# Patient Record
Sex: Female | Born: 2000 | Race: Asian | Hispanic: No | Marital: Single | Smoking: Never smoker
Health system: Southern US, Community
[De-identification: ages and names within clinical notes are randomized; demographics above are authoritative.]

## PROBLEM LIST (undated history)

## (undated) ENCOUNTER — Inpatient Hospital Stay (HOSPITAL_COMMUNITY): Payer: Medicaid Other

## (undated) DIAGNOSIS — H539 Unspecified visual disturbance: Secondary | ICD-10-CM

## (undated) DIAGNOSIS — T7840XA Allergy, unspecified, initial encounter: Secondary | ICD-10-CM

## (undated) DIAGNOSIS — R519 Headache, unspecified: Secondary | ICD-10-CM

## (undated) DIAGNOSIS — R51 Headache: Secondary | ICD-10-CM

## (undated) DIAGNOSIS — E669 Obesity, unspecified: Secondary | ICD-10-CM

## (undated) DIAGNOSIS — Z8489 Family history of other specified conditions: Secondary | ICD-10-CM

## (undated) DIAGNOSIS — N12 Tubulo-interstitial nephritis, not specified as acute or chronic: Secondary | ICD-10-CM

## (undated) DIAGNOSIS — J45909 Unspecified asthma, uncomplicated: Secondary | ICD-10-CM

## (undated) HISTORY — PX: TONSILLECTOMY: SUR1361

## (undated) HISTORY — PX: WISDOM TOOTH EXTRACTION: SHX21

## (undated) HISTORY — PX: ADENOIDECTOMY: SUR15

---
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
## 2020-01-01 ENCOUNTER — Ambulatory Visit: Payer: Medicaid Other | Admitting: Physical Therapy

## 2020-01-01 ENCOUNTER — Ambulatory Visit: Payer: Medicaid Other

## 2020-01-01 ENCOUNTER — Other Ambulatory Visit: Payer: Self-pay

## 2020-01-01 ENCOUNTER — Ambulatory Visit: Payer: Medicaid Other | Admitting: Occupational Therapy

## 2020-01-01 DIAGNOSIS — M6281 Muscle weakness (generalized): Secondary | ICD-10-CM | POA: Diagnosis not present

## 2020-01-01 DIAGNOSIS — R2689 Other abnormalities of gait and mobility: Secondary | ICD-10-CM

## 2020-01-01 DIAGNOSIS — R471 Dysarthria and anarthria: Secondary | ICD-10-CM

## 2020-01-01 DIAGNOSIS — R278 Other lack of coordination: Secondary | ICD-10-CM

## 2020-01-01 DIAGNOSIS — R29818 Other symptoms and signs involving the nervous system: Secondary | ICD-10-CM

## 2020-01-01 DIAGNOSIS — R41841 Cognitive communication deficit: Secondary | ICD-10-CM

## 2020-01-01 DIAGNOSIS — R2681 Unsteadiness on feet: Secondary | ICD-10-CM

## 2020-01-01 NOTE — Therapy (Signed)
[facility] [address] [place], [name], [address] Phone: [phone]   Fax:  [phone]  Occupational Therapy Treatment  Patient Details  Name: [name] MRN: [id number] Date of Birth: 05-14-2001 Referring Provider (OT): Dr. Gaynell Face   Encounter Date: 01/01/2020  OT End of Session - 01/01/20 1453    Visit Number  8    Number of Visits  25    Date for OT Re-Evaluation  01/24/20    Authorization Type  Medicaid    Authorization - Visit Number  7    Authorization - Number of Visits  24    OT Start Time  1450    OT Stop Time  1530    OT Time Calculation (min)  40 min    Activity Tolerance  Patient tolerated treatment well    Behavior During Therapy  [facility] for tasks assessed/performed       Past Medical History:  Diagnosis Date  . Allergy   . Allergy    Multiple allergies - see allergy list  . Alopecia   . Asthma   . Asthma   . Family history of adverse reaction to anesthesia    Mom - Difficult to anesthesize/Hypotension  . Headache    With wheezing per Mom  . 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 Pyelonephritis 07/12/2019  . Vision abnormalities    Diminished vision Left Eye per Mom    Past Surgical History:  Procedure Laterality Date  . ADENOIDECTOMY    . ESOPHAGOGASTRODUODENOSCOPY (EGD) WITH PROPOFOL N/A 09/27/2017   Procedure: ESOPHAGOGASTRODUODENOSCOPY (EGD) WITH PROPOFOL;  Surgeon: Joycelyn Rua, MD;  Location: [facility];  Service: Gastroenterology;  Laterality: N/A;  . PEG PLACEMENT N/A 09/27/2017   Procedure: PERCUTANEOUS ENDOSCOPIC GASTROSTOMY (PEG) PLACEMENT;  Surgeon: Joycelyn Rua, MD;  Location: [facility];  Service: Gastroenterology;  Laterality: N/A;  . TONSILLECTOMY    . WISDOM TOOTH EXTRACTION      There were no vitals filed for this visit.  Subjective Assessment - 01/01/20 1550    Currently in Pain?  No/denies    Pain Onset  More than a month ago                Treatment: therapist checked progress towards goals. Pt demonstrates excellent overall progress. Pt demonstrates ability to write  aparagraph with 100% legibility. See goals for updates.            OT Education - 01/01/20 1524    Education Details  reveiwed yellow theraband HEP, 15 reps each, min v.c    Person(s) Educated  Patient;Parent(s)    Methods  Explanation;Demonstration    Comprehension  Verbalized understanding;Returned demonstration;Verbal cues required       OT Short Term Goals - 01/01/20 1455      OT SHORT TERM GOAL #1   Title  I with inital HEP-12/10/2019    Baseline  dependent    Time  6    Period  Weeks    Status  On-going    Target Date  12/10/19      OT SHORT TERM GOAL #2   Title  Pt will wash her hair with min A and min v.c    Baseline  mod A with hair washing    Time  6    Period  Weeks    Status  Achieved   partially met, pt does 50% of the time     OT SHORT TERM GOAL #3  Title  Pt will increase bilateral grip strength by 5 lbs for increased functional use during ADLs    Baseline  RUE avg 33.6, LUE avg 34.1    Time  6    Period  Weeks    Status  Achieved   LUE 39.6, RUE43.1     OT SHORT TERM GOAL #4   Title  Pt will demonstrate ability to write a short paragraph with 100% legibility in a reasonable amount of time    Baseline  demonstrates good legibility at sentence level but requires increased time    Time  6    Period  Weeks    Status  Achieved   100%     OT SHORT TERM GOAL #5   Title  Pt will demonstrate improved fine motor coordination for ADLS as evidenced by decreasing 9 hole peg test score to 70 secs    Baseline  RUE 80 secs    Time  6    Period  Weeks    Status  Achieved   53.22 secs     OT SHORT TERM GOAL #6   Title  Pt will demonstrate improved fine motor coordination for ADLs as evidenced by decreasing LUE 9 hole peg test score to 2 mins and 30 secs    Baseline  2 mins 39 secs    Time  6    Period   Weeks    Status  Achieved   1 min 26 secs     OT SHORT TERM GOAL #7   Title  Pt/ mother will verbalize understanding of adapted strategies to maximize pt's safety and independence with ADLs/ IADLs.    Baseline  dependent    Time  6    Period  Weeks    Status  On-going        OT Long Term Goals - 10/26/19 1204      OT LONG TERM GOAL #1   Title  I with updated HEP.01/24/2020    Baseline  dependent    Time  12    Period  Weeks    Status  New    Target Date  01/24/20      OT LONG TERM GOAL #2   Title  Pt will perfrom basic home management tasks modified independently.    Baseline  dependent    Time  12    Period  Weeks    Status  New      OT LONG TERM GOAL #3   Title  Pt will perform simple cooking tasks with supervision and min v.c    Baseline  dependent    Time  12    Period  Weeks    Status  New      OT LONG TERM GOAL #4   Title  Pt will wash and style her hair modified independently.    Baseline  mod A    Time  12    Period  Weeks      OT LONG TERM GOAL #5   Title  Pt will demonstrate adequate bilateral UE control in order to paint her own nails.    Baseline  dependent    Time  12    Period  Weeks    Status  New            Plan - 01/01/20 1525    Clinical Impression Statement  Pt demonstrates good progress towards short term goals. Patient missed last week due to illness.  OT Occupational Profile and History  Problem Focused Assessment - Including review of records relating to presenting problem    Occupational performance deficits (Please refer to evaluation for details):  ADL's;IADL's;Education;Leisure;Social Participation    Body Structure / Function / Physical Skills  ADL;UE functional use;Balance;Vision;Flexibility;FMC;ROM;Gait;Coordination;GMC;Decreased knowledge of precautions;IADL;Decreased knowledge of use of DME;Dexterity;Strength;Mobility    Cognitive Skills  Attention;Energy/Drive;Memory;Problem Solve;Safety Awareness;Understand;Thought     Rehab Potential  Good    Clinical Decision Making  Limited treatment options, no task modification necessary    Comorbidities Affecting Occupational Performance:  May have comorbidities impacting occupational performance    Modification or Assistance to Complete Evaluation   No modification of tasks or assist necessary to complete eval    OT Frequency  2x / week   plus eval   OT Duration  12 weeks    OT Treatment/Interventions  Self-care/ADL training;Ultrasound;Visual/perceptual remediation/compensation;Energy conservation;Aquatic Therapy;DME and/or AE instruction;Patient/family education;Balance training;Passive range of motion;Paraffin;Cryotherapy;Fluidtherapy;Functional Mobility Training;Moist Heat;Therapeutic exercise;Manual Therapy;Therapeutic activities;Cognitive remediation/compensation;Neuromuscular education    Plan  check theraputty HEP,    Consulted and Agree with Plan of Care  Patient;Family member/caregiver    Family Member Consulted  mother       Patient will benefit from skilled therapeutic intervention in order to improve the following deficits and impairments:   Body Structure / Function / Physical Skills: ADL, UE functional use, Balance, Vision, Flexibility, FMC, ROM, Gait, Coordination, GMC, Decreased knowledge of precautions, IADL, Decreased knowledge of use of DME, Dexterity, Strength, Mobility Cognitive Skills: Attention, Energy/Drive, Memory, Problem Solve, Safety Awareness, Understand, Thought     Visit Diagnosis: Muscle weakness (generalized)  Other lack of coordination  Other abnormalities of gait and mobility  Unsteadiness on feet  Other symptoms and signs involving the nervous system    Problem List Patient Active Problem List   Diagnosis Date Noted  . Muscle spasm of back 07/14/2019  . Pyelonephritis 07/12/2019  . COVID-19 07/12/2019  . Peripheral neuropathy 08/24/2018  . Poor sleep hygiene 07/11/2018  . 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 Left hemiparesis ([place]) 01/17/2018  . 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 Anoxic brain injury ([place]) 09/28/2017  . 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 Spasticity 09/28/2017  . Encephalopathy 09/19/2017  . PCOS (polycystic ovarian syndrome) 09/29/2016  . Low bone density 09/29/2016  . 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 Severe asthma with acute exacerbation 07/26/2015  . Seasonal and perennial allergic rhinitis 07/26/2015  . Obesity peds (BMI >=95 percentile) 02/29/2012    Deundre Thong 01/01/2020, 3:50 PM  [facility] [address] [place], [name], [address] Phone: [phone]   Fax:  [phone]  Name: [name] MRN: [id number] Date of Birth: 30-Mar-2001

## 2020-01-01 NOTE — Patient Instructions (Signed)
   Practice abdominal breathing 15 minutes x2/day.   If you can, extend the breath cycle past one second.

## 2020-01-01 NOTE — Therapy (Signed)
[facility] [address] [place], [name], [address] Phone: [phone]   Fax:  [phone]  Speech Language Pathology Treatment  Patient Details  Name: [name] MRN: [id number] Date of Birth: 2001-02-15 Referring Provider (SLP): Wyline Copas, MD   Encounter Date: 01/01/2020  End of Session - 01/01/20 1636    Visit Number  2    Number of Visits  17    Date for SLP Re-Evaluation  02/16/20    Authorization Type  Medicaid (pt/ot plan for 24 sessions each)    Authorization - Visit Number  1    Authorization - Number of Visits  16    SLP Start Time  3875    SLP Stop Time   6433    SLP Time Calculation (min)  42 min    Activity Tolerance  Patient tolerated treatment well;No increased pain       Past Medical History:  Diagnosis Date  . Allergy   . Allergy    Multiple allergies - see allergy list  . Alopecia   . Asthma   . Asthma   . Family history of adverse reaction to anesthesia    Mom - Difficult to anesthesize/Hypotension  . Headache    With wheezing per Mom  . 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 Pyelonephritis 07/12/2019  . Vision abnormalities    Diminished vision Left Eye per Mom    Past Surgical History:  Procedure Laterality Date  . ADENOIDECTOMY    . ESOPHAGOGASTRODUODENOSCOPY (EGD) WITH PROPOFOL N/A 09/27/2017   Procedure: ESOPHAGOGASTRODUODENOSCOPY (EGD) WITH PROPOFOL;  Surgeon: Joycelyn Rua, MD;  Location: [facility];  Service: Gastroenterology;  Laterality: N/A;  . PEG PLACEMENT N/A 09/27/2017   Procedure: PERCUTANEOUS ENDOSCOPIC GASTROSTOMY (PEG) PLACEMENT;  Surgeon: Joycelyn Rua, MD;  Location: [facility];  Service: Gastroenterology;  Laterality: N/A;  . TONSILLECTOMY    . WISDOM TOOTH EXTRACTION      There were no vitals filed for this visit.  Subjective Assessment - 01/01/20 1545    Subjective  "[name]"    Patient is accompained by:   Family member   mom   Currently in Pain?  No/denies            ADULT SLP TREATMENT - 01/01/20 1545      General Information   Behavior/Cognition  Alert;Pleasant mood;Cooperative      Treatment Provided   Treatment provided  Cognitive-Linquistic      Cognitive-Linquistic Treatment   Treatment focused on  Dysarthria;Apraxia    Skilled Treatment  SLP asked pt what she recalls about breathing from previous ST - pt reports it's been 6 months since last ST and she did not recall anything. Pt was working at spontaneous sentence level with coordination of respiratory and articulatory systems, pt stated previous SLP had pt bringing IN stomach with inhalation. SLp worked with pt with abdominal breathing (AB) with initial success approx 75% in total 7 minutes of practice. VERY SHALLOW breath cycle so SLP had pt take as deep of breaths aas she could -even with clavicular breathing x10 to give some tactile reinforcement of how much lung capacity pt has. When SLP went back to AB pt now breathing with clavicular breathing x3 attempts at AB. SLP backtracked to sole focus on AB instead of AB with longer breath cycle. After approx 15 breaths pt appeared to attempt to incr time of breath cycle to total 3+  seconds (from ~2 seconds) and was successful approx 30% of remaining practice (approx 60 seconds). SLP told pt to practice AB 15 minutes x2/day, and told pt about the breathe 2 relax app and tht it would try to have her inhale for 7 seconds and pt unlikely to achieve that at this time but encouraged longer breath cycle, with caveat that pt had to be able to master AB first then she could try the extending breath cycle.       Assessment / Recommendations / Plan   Plan  Continue with current plan of care      Progression Toward Goals   Progression toward goals  Progressing toward goals       SLP Education - 01/01/20 1636    Education Details  abdominal breathing, breathe 2 relax app,    Person(s)  Educated  Patient;Parent(s)    Methods  Explanation;Demonstration;Verbal cues    Comprehension  Verbalized understanding;Returned demonstration;Verbal cues required;Need further instruction       SLP Short Term Goals - 01/01/20 1637      SLP SHORT TERM GOAL #1   Title  Patient will demonstrate abdominal breathing at rest 85% accuracy over 5 minutes.    Baseline  clavicular breathing    Time  4    Period  Weeks    Status  On-going      SLP SHORT TERM GOAL #2   Title  Patient will coordination respiration and phonation at phrase level 85% accuracy x 3 sessions.    Baseline  50%    Time  4    Period  Weeks    Status  On-going      SLP SHORT TERM GOAL #3   Title  Patient will use slowed rate to compensate for dysarthria and allow additional motor processing time over 5 minute simple conversation with rare min cues.    Baseline  not currently using strategies    Time  4    Period  Weeks    Status  On-going       SLP Long Term Goals - 01/01/20 1637      SLP LONG TERM GOAL #1   Title  Patient will generate sentences with abdominal breathing 85% accuracy x3 sessions.    Baseline  0%    Time  8    Period  Weeks    Status  On-going      SLP LONG TERM GOAL #2   Title  Patient will increase breath groups to average 3+ words in 5 minute simple conversation x3 visits.    Baseline  average 1 word    Time  8    Period  Weeks    Status  On-going      SLP LONG TERM GOAL #3   Title  Patient will increase intelligibility from 70% to 85% in 5 minutes simple conversation with use of breathing techniques and compensations for dysarthria.    Baseline  75%    Time  8    Period  Weeks    Status  On-going       Plan - 01/01/20 1637    Clinical Impression Statement  Patient presents with moderate-severe ataxic dysarthria; discoordination impacts all speech subsystems, including respiration, phonation, resonance and articulation. Quivering/trembling movements noted throughout.  Cognitive/processing deficits appear to negatively impact speech production; pt had more difficulty with reading tasks than in spontaneous speech. Patient and mother report priority is pt's intelligibility at this time. Patient enjoys games, anime, and  communicating online with friends in [place]. She hopes to eventually complete remaining 5 HS credits at [employer], but does not have plans to do at this time. I recommend skilled ST to address dysarthria in order to improve pt's intelligibility and increase length of utterance for simple conversations with friends and family. ST to consider assessment/treatment of cognitive-linguistic deficits but will focus on dysarthria at this time due to patient goals.    Speech Therapy Frequency  2x / week    Duration  --   8 weeks or 17 visits   Treatment/Interventions  Language facilitation;Environmental controls;Cueing hierarchy;SLP instruction and feedback;Compensatory techniques;Cognitive reorganization;Functional tasks;Compensatory strategies;Internal/external aids;Multimodal communcation approach;Patient/family education    Potential to Achieve Goals  Good    Potential Considerations  Other (comment);Cooperation/participation level   time post onset   Consulted and Agree with Plan of Care  Patient       Patient will benefit from skilled therapeutic intervention in order to improve the following deficits and impairments:   Dysarthria and anarthria  Cognitive communication deficit    Problem List Patient Active Problem List   Diagnosis Date Noted  . Muscle spasm of back 07/14/2019  . Pyelonephritis 07/12/2019  . COVID-19 07/12/2019  . Peripheral neuropathy 08/24/2018  . Poor sleep hygiene 07/11/2018  . 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 Left hemiparesis (HCC) 01/17/2018  . 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 Anoxic brain injury (HCC) 09/28/2017  .  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 Spasticity 09/28/2017  . Encephalopathy 09/19/2017  . PCOS (polycystic ovarian syndrome) 09/29/2016  . Low bone density 09/29/2016  . 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 Severe asthma with acute exacerbation 07/26/2015  . Seasonal and perennial allergic rhinitis 07/26/2015  . Obesity peds (BMI >=95 percentile) 02/29/2012    [facility] ,MS, CCC-SLP  01/01/2020, 4:38 PM  [facility] [address] [place], [place], [address] Phone: [phone]   Fax:  [phone]   Name: [name] MRN: [id number] Date of Birth: 10-26-2001

## 2020-01-03 ENCOUNTER — Other Ambulatory Visit: Payer: Self-pay

## 2020-01-03 ENCOUNTER — Encounter: Payer: Self-pay | Admitting: Physical Therapy

## 2020-01-03 ENCOUNTER — Ambulatory Visit: Payer: Medicaid Other | Admitting: Speech Pathology

## 2020-01-03 ENCOUNTER — Ambulatory Visit (INDEPENDENT_AMBULATORY_CARE_PROVIDER_SITE_OTHER): Payer: Medicaid Other | Admitting: Pediatrics

## 2020-01-03 ENCOUNTER — Ambulatory Visit: Payer: Medicaid Other | Admitting: Physical Therapy

## 2020-01-03 ENCOUNTER — Ambulatory Visit: Payer: Medicaid Other | Admitting: Occupational Therapy

## 2020-01-03 DIAGNOSIS — R278 Other lack of coordination: Secondary | ICD-10-CM

## 2020-01-03 DIAGNOSIS — M6281 Muscle weakness (generalized): Secondary | ICD-10-CM

## 2020-01-03 DIAGNOSIS — R29818 Other symptoms and signs involving the nervous system: Secondary | ICD-10-CM

## 2020-01-03 DIAGNOSIS — R2681 Unsteadiness on feet: Secondary | ICD-10-CM

## 2020-01-03 DIAGNOSIS — R471 Dysarthria and anarthria: Secondary | ICD-10-CM

## 2020-01-03 DIAGNOSIS — R2689 Other abnormalities of gait and mobility: Secondary | ICD-10-CM

## 2020-01-03 NOTE — Therapy (Signed)
[facility] [address] [place], [name], [address] Phone: [phone]   Fax:  [phone]  Physical Therapy Treatment  Patient Details  Name: [name] MRN: [id number] Date of Birth: [birth year] Referring Provider (PT): Dr. Wyline Copas   Encounter Date: 01/03/2020  PT End of Session - 01/03/20 2218    Visit Number  14    Number of Visits  24    Date for PT Re-Evaluation  02/27/20    Authorization Type  Medicaid    Authorization Time Period  16 visits approved from 10/08/19-12/02/2019; 16 visits approved 12/05/2019-01/29/2020    Authorization - Visit Number  5    Authorization - Number of Visits  16    PT Start Time  4665    PT Stop Time  9935    PT Time Calculation (min)  43 min    Equipment Utilized During Treatment  Gait belt    Activity Tolerance  Patient tolerated treatment well;No increased pain    Behavior During Therapy  WFL for tasks assessed/performed       Past Medical History:  Diagnosis Date  . Allergy   . Allergy    Multiple allergies - see allergy list  . Alopecia   . Asthma   . Asthma   . Family history of adverse reaction to anesthesia    Mom - Difficult to anesthesize/Hypotension  . Headache    With wheezing per Mom  . 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 Pyelonephritis 07/12/2019  . Vision abnormalities    Diminished vision Left Eye per Mom    Past Surgical History:  Procedure Laterality Date  . ADENOIDECTOMY    . ESOPHAGOGASTRODUODENOSCOPY (EGD) WITH PROPOFOL N/A 09/27/2017   Procedure: ESOPHAGOGASTRODUODENOSCOPY (EGD) WITH PROPOFOL;  Surgeon: Joycelyn Rua, MD;  Location: [facility];  Service: Gastroenterology;  Laterality: N/A;  . PEG PLACEMENT N/A 09/27/2017   Procedure: PERCUTANEOUS ENDOSCOPIC GASTROSTOMY (PEG) PLACEMENT;  Surgeon: Joycelyn Rua, MD;  Location: [facility];  Service: Gastroenterology;  Laterality: N/A;  . TONSILLECTOMY    . WISDOM TOOTH EXTRACTION       There were no vitals filed for this visit.  Subjective Assessment - 01/03/20 1407    Subjective  No new complaints. No falls or pain to report.    Patient is accompained by:  Family member   mom   Patient Stated Goals  Improve balance and core (mom); pt wants to walk without rollator    Currently in Pain?  No/denies    Pain Score  0-No pain         [facility] PT Assessment - 01/03/20 1409      Timed Up and Go Test   TUG  Normal TUG    Normal TUG (seconds)  16.53   with rollator   TUG Comments  Scores >13.5 seconds indicate increased fall risk           [facility] Adult PT Treatment/Exercise - 01/03/20 1409      Transfers   Transfers  Sit to Stand;Stand to Sit    Sit to Stand  5: Supervision;Without upper extremity assist;From bed;From chair/3-in-1    Five time sit to stand comments   11.85 sec's no UE support from standard height chair.     Stand to Sit  5: Supervision;Without upper extremity assist;To bed;To chair/3-in-1    Floor to Transfer  --      Ambulation/Gait   Ambulation/Gait  Yes    Ambulation/Gait Assistance  6: Modified independent (Device/Increase time);4: Min assist    Ambulation/Gait Assistance Details  up to min assist for gait with no AD due to ataxic movements with wide base of support    Ambulation Distance (Feet)  50 Feet   x1   Assistive device  Rollator;None    Gait Pattern  Step-through pattern;Ataxic;Wide base of support;Left genu recurvatum    Ambulation Surface  Level;Indoor      Self-Care   Self-Care  Other Self-Care Comments    Other Self-Care Comments   reviewed HEP. Pt has not been doing the HEP. discussed importance of complaince for maximal gains, that twice a week in PT is not going to get her there. Both pt and mom verbalized understanding.            Balance Exercises - 01/03/20 1422      Balance Exercises: Standing   Balance Beam  standing across blue foam beam: alternating fwd stepping to floor/back onto beam, then alternating  bwd stepping to floor/back onto beam for 10 reps each with light UE support on bars, cues for step length/step height. min assist for balance.     Tandem Gait  Forward;Retro;Upper extremity support;3 reps;Limitations    Tandem Gait Limitations  on blue foam beam for 3 laps each way with increased LE shaking/difficulty stepping with retro. min guard to min assist for balance.     Sidestepping  Foam/compliant support;Upper extremity support;3 reps;Limitations    Sidestepping Limitations  on blue balance beam for 3 laps each way with no to light UE support on bars, cues on form and posture.           PT Short Term Goals - 01/03/20 1411      PT SHORT TERM GOAL #1   Title  Pt will perform HEP with family supervision for improved balance, strength, decreased pain, gait and transfers.  TARGET for all STGs:  4 weeks, 12/28/2019    Baseline  01/03/20: pt has HEP. Continues to be inconsistent with performance.    Status  Not Met      PT SHORT TERM GOAL #2   Title  Pt will improve TUG score to less than or equal to 15 seconds for decreased fall risk    Baseline  01/03/20: 16.53 sec's with rollator, improved just not to goal    Time  --    Period  --    Status  Partially Met      PT SHORT TERM GOAL #3   Title  Pt will improve 5x sit<>stand test to less than or equal to 16 seconds to demonstrate improved functional strength.    Baseline  01/03/20: 11.85 sec's no UE support using standard height chair surface    Time  --    Period  --    Status  Achieved      PT SHORT TERM GOAL #4   Title  Pt will amb. 50-154f without device with supervision to demo improved balance with gait.    Baseline  01/03/20: able to ambulate 50 feet with no AD with min assist, met distance portion, not assist level    Time  --    Period  --    Status  Partially Met        PT Long Term Goals - 12/25/19 2215      PT LONG TERM GOAL #1   Title  Pt will perform progressive HEP with family supervision, for improved  strength, balance, gait.  TARGET for  all LTGs:  01/25/2020    Baseline  Not currently consistently perfroming HEP, only walking at home 11/29/2019    Time  8    Period  Weeks    Status  On-going      PT LONG TERM GOAL #2   Title  Pt will improve TUG score to less than or equal to 13.5 seconds for decreased fall risk.    Baseline  TTUG 20.09 sec at eval; 11/29/2019:  20.19 sec    Time  8    Period  Weeks    Status  On-going      PT LONG TERM GOAL #3   Title  Pt will improve gait velocity to at least 2 ft/sec for improved gait efficiency and safety.    Baseline  1.78 ft/sec at eval    Time  8    Period  Weeks    Status  On-going      PT LONG TERM GOAL #4   Title  Pt will improve Berg Balance score to at least 47/56 for decreased fall risk.    Baseline  Berg 45/56 at eval; 44/56 11/29/2019    Time  8    Period  Weeks    Status  Revised      PT LONG TERM GOAL #5   Title  Negotiate 4 steps with use of 1 rail using a step over step sequence for ascension & descension with supervision.    Baseline  Per report at eval, pt uses step-to pattern with +2 supervision and rails.; 11/29/2019:  ascending 1 rail with step-to pattern, last step up with step through pattern; descending step-to pattern    Time  8    Period  Weeks    Status  On-going            Plan - 01/03/20 2219    Clinical Impression Statement  Today's skilled session initially focused on progress toward STGs with 2 goals met and 2 goals partially met. Remainder of session continued to focus on balance reactions with rest breaks taken due to fatigue. The pt is progressing toward goals and should benefit from continued PT to progress toward unmet goals.    Personal Factors and Comorbidities  Comorbidity 3+;Time since onset of injury/illness/exacerbation    Comorbidities  Asthma, headaches, obesity, muscle spasms of back, COVID-19    Examination-Activity Limitations  Stand;Transfers;Locomotion Level    Examination-Participation  Restrictions  Community Activity;School   travel in RV with family   Stability/Clinical Decision Making  Evolving/Moderate complexity    Rehab Potential  Good    PT Frequency  2x / week    PT Duration  8 weeks   per recert 05/27/1949   PT Treatment/Interventions  ADLs/Self Care Home Management;Aquatic Therapy;Therapeutic exercise;Therapeutic activities;Stair training;Gait training;DME Instruction;Balance training;Neuromuscular re-education;Patient/family education    PT Next Visit Plan  work on core stability, lower extremity strength, SLS, tandem stance/gait; incorporating head turns/nods with gait;    PT Home Exercise Plan  [facility]    Consulted and Agree with Plan of Care  Patient;Family member/caregiver    Family Member Consulted  mom-[name]       Patient will benefit from skilled therapeutic intervention in order to improve the following deficits and impairments:  Abnormal gait, Decreased activity tolerance, Decreased balance, Decreased mobility, Decreased endurance, Decreased coordination, Difficulty walking, Decreased strength, Postural dysfunction, Pain, Impaired tone  Visit Diagnosis: Muscle weakness (generalized)  Other abnormalities of gait and mobility  Unsteadiness on feet  Problem List Patient Active Problem List   Diagnosis Date Noted  . Muscle spasm of back 07/14/2019  . Pyelonephritis 07/12/2019  . COVID-19 07/12/2019  . Peripheral neuropathy 08/24/2018  . Poor sleep hygiene 07/11/2018  . 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 Left hemiparesis ([place]) 01/17/2018  . 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 Anoxic brain injury ([place]) 09/28/2017  . 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 Spasticity 09/28/2017  . Encephalopathy 09/19/2017  . PCOS (polycystic ovarian syndrome) 09/29/2016  . Low bone density 09/29/2016  . 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 Severe asthma with acute exacerbation  07/26/2015  . Seasonal and perennial allergic rhinitis 07/26/2015  . Obesity peds (BMI >=95 percentile) 02/29/2012    Willow Ora, PTA, [facility] [address] [place], [place] [address] [phone] 01/04/20, 9:14 AM   Name: BRUNETTA NEWINGHAM MRN: [id number] Date of Birth: [birth year]

## 2020-01-03 NOTE — Patient Instructions (Signed)
Keep practicing your abdominal breathing 15 minutes, twice a day. After that, add some voice/speech. Full breath, then "ahhhhhhh" 10 times, long and slow, like your exhale. Full breath then say 1) 10 anime characters 2) 10 tv shows or movies 3) 10 friend or family names  Remember to take a full, slow breath before EACH one. No rapid lists.   Practice some phrases from [employer]: SLOW DOWN. Take full deep breaths Wind, hear me [employer], at the ready! There'll be time for this later... Come sit and chat with me. A knight's duty goes beyond the sword. The best magic is diligence.  Eyes forward, to the new dawn.  I hope I'll be tall as my sister one day. My liege! [employer] adventure [employer], roll out I feel like running.  Nice and spicy!

## 2020-01-04 ENCOUNTER — Encounter: Payer: Self-pay | Admitting: Occupational Therapy

## 2020-01-04 ENCOUNTER — Ambulatory Visit: Payer: Medicaid Other | Admitting: Physical Therapy

## 2020-01-04 ENCOUNTER — Encounter: Payer: Self-pay | Admitting: Physical Therapy

## 2020-01-04 DIAGNOSIS — R2689 Other abnormalities of gait and mobility: Secondary | ICD-10-CM

## 2020-01-04 DIAGNOSIS — R2681 Unsteadiness on feet: Secondary | ICD-10-CM

## 2020-01-04 DIAGNOSIS — M6281 Muscle weakness (generalized): Secondary | ICD-10-CM

## 2020-01-04 NOTE — Therapy (Addendum)
[facility] [address] [place], [name], [address] Phone: [phone]   Fax:  [phone]  Occupational Therapy Treatment  Patient Details  Name: [name] MRN: [id number] Date of Birth: [birth year] Referring Provider (OT): Dr. Gaynell Face   Encounter Date: 01/03/2020  OT End of Session - 01/04/20 0946    Visit Number  9    Number of Visits  25    Date for OT Re-Evaluation  01/24/20    Authorization Type  Medicaid    Authorization - Visit Number  8    Authorization - Number of Visits  24    OT Start Time  8299    OT Stop Time  1530    OT Time Calculation (min)  38 min    Activity Tolerance  Patient tolerated treatment well    Behavior During Therapy  [facility] for tasks assessed/performed       Past Medical History:  Diagnosis Date  . Allergy   . Allergy    Multiple allergies - see allergy list  . Alopecia   . Asthma   . Asthma   . Family history of adverse reaction to anesthesia    Mom - Difficult to anesthesize/Hypotension  . Headache    With wheezing per Mom  . 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 Pyelonephritis 07/12/2019  . Vision abnormalities    Diminished vision Left Eye per Mom    Past Surgical History:  Procedure Laterality Date  . ADENOIDECTOMY    . ESOPHAGOGASTRODUODENOSCOPY (EGD) WITH PROPOFOL N/A 09/27/2017   Procedure: ESOPHAGOGASTRODUODENOSCOPY (EGD) WITH PROPOFOL;  Surgeon: Joycelyn Rua, MD;  Location: [facility];  Service: Gastroenterology;  Laterality: N/A;  . PEG PLACEMENT N/A 09/27/2017   Procedure: PERCUTANEOUS ENDOSCOPIC GASTROSTOMY (PEG) PLACEMENT;  Surgeon: Joycelyn Rua, MD;  Location: [facility];  Service: Gastroenterology;  Laterality: N/A;  . TONSILLECTOMY    . WISDOM TOOTH EXTRACTION      There were no vitals filed for this visit.  Subjective Assessment - 01/04/20 0946    Currently in Pain?  No/denies    Pain Onset  More than a month ago            Treatment: Pt brought in her hair clips and she practiced putting hair clips/ bobby pins in her hair suing a mirror and min v.c from therapist. Placing various sized pegs into pegboard with right and left UE's, min difficulty with RUE, min-mod difficulty with LUE Reviewed yellow theraband HEP, 10 reps each bilateral UE's, min v.c for performance                  OT Short Term Goals - 01/01/20 1455      OT SHORT TERM GOAL #1   Title  I with inital HEP-12/10/2019    Baseline  dependent    Time  6    Period  Weeks    Status  Achieved   Target Date       OT SHORT TERM GOAL #2   Title  Pt will wash her hair with min A and min v.c    Baseline  mod A with hair washing    Time  6    Period  Weeks    Status  Achieved   partially met, pt does 50% of the time     OT SHORT TERM GOAL #3   Title  Pt will increase bilateral grip strength by 5 lbs for increased functional use during  ADLs    Baseline  RUE avg 33.6, LUE avg 34.1    Time  6    Period  Weeks    Status  Achieved   LUE 39.6, RUE43.1     OT SHORT TERM GOAL #4   Title  Pt will demonstrate ability to write a short paragraph with 100% legibility in a reasonable amount of time    Baseline  demonstrates good legibility at sentence level but requires increased time    Time  6    Period  Weeks    Status  Achieved   100%     OT SHORT TERM GOAL #5   Title  Pt will demonstrate improved fine motor coordination for ADLS as evidenced by decreasing 9 hole peg test score to 70 secs - met  revised goal- Pt will demonstrate improved fine motor coordination for ADLs as evidenced by completing 9 hole peg test in 49 secs or less.   Baseline  RUE 53.22   Time  6    Period  Weeks    Status  initial goal achieved, see above for revised goal      OT SHORT TERM GOAL #6   Title  Pt will demonstrate improved fine motor coordination for ADLs as evidenced by decreasing LUE 9 hole peg test score to 2 mins and 30 secs  -met  Revised goal-Pt will demonstrate improved fine motor coordination for ADLs by decreasing 9 hole peg test score to 1 min 15 secs    Baseline  1 min 26 secs   Time  6    Period  Weeks    Status Initial goal achieved, see updated goal above      OT SHORT TERM GOAL #7   Title  Pt/ mother will verbalize understanding of adapted strategies to maximize pt's safety and independence with ADLs/ IADLs.    Baseline Pt can benefit from reinforcement of strategies   Time  6    Period  Weeks    Status  NEw       OT Long Term Goals - 10/26/19 1204      OT LONG TERM GOAL #1   Title  I with updated HEP.01/24/2020    Baseline  dependent updated HEP not issued yet   Time  12    Period  Weeks    Status  new   Target Date  01/24/20      OT LONG TERM GOAL #2   Title  Pt will perfrom basic home management tasks modified independently.    Baseline  Pt performs simple snack prep, however she is not completing additional home management   Time  12    Period  Weeks    Status  New      OT LONG TERM GOAL #3   Title  Pt will perform simple cooking tasks with supervision and min v.c    Baseline  Pt performs snack prep, however she does not consistently perform cooking activities   Time  12    Period  Weeks    Status  New      OT LONG TERM GOAL #4   Title  Pt will wash and style her hair modified independently.    Baseline  min A   Time  12    Period  Weeks  NEW     OT LONG TERM GOAL #5   Title  Pt will demonstrate adequate bilateral UE control in order to paint her own nails.  Baseline  mod A   Time  12    Period  Weeks    Status  New            Plan - 01/04/20 0947    Clinical Impression Statement  Pt demonstrates improving bilateral UE coordination and functional use.    OT Occupational Profile and History  Problem Focused Assessment - Including review of records relating to presenting problem    Occupational performance deficits (Please refer to evaluation for details):   ADL's;IADL's;Education;Leisure;Social Participation    Body Structure / Function / Physical Skills  ADL;UE functional use;Balance;Vision;Flexibility;FMC;ROM;Gait;Coordination;GMC;Decreased knowledge of precautions;IADL;Decreased knowledge of use of DME;Dexterity;Strength;Mobility    Cognitive Skills  Attention;Energy/Drive;Memory;Problem Solve;Safety Awareness;Understand;Thought    Rehab Potential  Good    Clinical Decision Making  Limited treatment options, no task modification necessary    Comorbidities Affecting Occupational Performance:  May have comorbidities impacting occupational performance    Modification or Assistance to Complete Evaluation   No modification of tasks or assist necessary to complete eval    OT Frequency  1x / week x   OT Duration  6 weeks   OT Treatment/Interventions  Self-care/ADL training;Ultrasound;Visual/perceptual remediation/compensation;Energy conservation;Aquatic Therapy;DME and/or AE instruction;Patient/family education;Balance training;Passive range of motion;Paraffin;Cryotherapy;Fluidtherapy;Functional Mobility Training;Moist Heat;Therapeutic exercise;Manual Therapy;Therapeutic activities;Cognitive remediation/compensation;Neuromuscular education    Plan  check theraputty HEP,    Consulted and Agree with Plan of Care  Patient;Family member/caregiver    Family Member Consulted  mother       Patient will benefit from skilled therapeutic intervention in order to improve the following deficits and impairments:   Body Structure / Function / Physical Skills: ADL, UE functional use, Balance, Vision, Flexibility, FMC, ROM, Gait, Coordination, GMC, Decreased knowledge of precautions, IADL, Decreased knowledge of use of DME, Dexterity, Strength, Mobility Cognitive Skills: Attention, Energy/Drive, Memory, Problem Solve, Safety Awareness, Understand, Thought     Visit Diagnosis: Muscle weakness (generalized)  Other lack of coordination  Other symptoms and signs  involving the nervous system    Problem List Patient Active Problem List   Diagnosis Date Noted  . Muscle spasm of back 07/14/2019  . Pyelonephritis 07/12/2019  . COVID-19 07/12/2019  . Peripheral neuropathy 08/24/2018  . Poor sleep hygiene 07/11/2018  . 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 Left hemiparesis ([place]) 01/17/2018  . 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 Anoxic brain injury ([facility] [facility]) 09/28/2017  . 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 Spasticity 09/28/2017  . Encephalopathy 09/19/2017  . PCOS (polycystic ovarian syndrome) 09/29/2016  . Low bone density 09/29/2016  . 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 Severe asthma with acute exacerbation 07/26/2015  . Seasonal and perennial allergic rhinitis 07/26/2015  . Obesity peds (BMI >=95 percentile) 02/29/2012    Carletta Feasel 01/04/2020, 9:48 AM Theone Murdoch, OTR/L [phone] Phone: [phone] 3:29 PM 01/24/20 [facility] [address] [place], [name], [address] Phone: [phone]   Fax:  [phone]  Name: [name] MRN: [id number] Date of Birth: May 26, 2001

## 2020-01-04 NOTE — Therapy (Signed)
[facility] [address] [place], [name], [address] Phone: [phone]   Fax:  [phone]  Physical Therapy Treatment  Patient Details  Name: [name] MRN: [id number] Date of Birth: [birth year] Referring Provider (PT): Dr. Wyline Copas   Encounter Date: 01/04/2020  PT End of Session - 01/04/20 1233    Visit Number  15    Number of Visits  24    Date for PT Re-Evaluation  02/27/20    Authorization Type  Medicaid    Authorization Time Period  16 visits approved from 10/08/19-12/02/2019; 16 visits approved 12/05/2019-01/29/2020    Authorization - Visit Number  6    Authorization - Number of Visits  16    PT Start Time  1232    PT Stop Time  1311    PT Time Calculation (min)  39 min    Equipment Utilized During Treatment  Gait belt    Activity Tolerance  Patient tolerated treatment well;No increased pain    Behavior During Therapy  WFL for tasks assessed/performed       Past Medical History:  Diagnosis Date  . Allergy   . Allergy    Multiple allergies - see allergy list  . Alopecia   . Asthma   . Asthma   . Family history of adverse reaction to anesthesia    Mom - Difficult to anesthesize/Hypotension  . Headache    With wheezing per Mom  . 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 Pyelonephritis 07/12/2019  . Vision abnormalities    Diminished vision Left Eye per Mom    Past Surgical History:  Procedure Laterality Date  . ADENOIDECTOMY    . ESOPHAGOGASTRODUODENOSCOPY (EGD) WITH PROPOFOL N/A 09/27/2017   Procedure: ESOPHAGOGASTRODUODENOSCOPY (EGD) WITH PROPOFOL;  Surgeon: Joycelyn Rua, MD;  Location: [facility];  Service: Gastroenterology;  Laterality: N/A;  . PEG PLACEMENT N/A 09/27/2017   Procedure: PERCUTANEOUS ENDOSCOPIC GASTROSTOMY (PEG) PLACEMENT;  Surgeon: Joycelyn Rua, MD;  Location: [facility];  Service: Gastroenterology;  Laterality: N/A;  . TONSILLECTOMY    . WISDOM TOOTH EXTRACTION       There were no vitals filed for this visit.  Subjective Assessment - 01/04/20 1233    Subjective  No new complaints. No falls or pain to report.    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  No/denies    Pain Score  0-No pain           [facility] Adult PT Treatment/Exercise - 01/04/20 1234      Transfers   Transfers  Sit to Stand;Stand to Sit    Sit to Stand  5: Supervision;Without upper extremity assist;From bed;From chair/3-in-1    Stand to Sit  5: Supervision;Without upper extremity assist;To bed;To chair/3-in-1    Floor to Transfer  5: Supervision;With upper extremity assist      Ambulation/Gait   Ambulation/Gait  Yes    Ambulation/Gait Assistance  6: Modified independent (Device/Increase time);4: Min assist    Ambulation/Gait Assistance Details  use of rollator to enter/exit for session    Assistive device  Rollator    Gait Pattern  Step-through pattern;Ataxic;Wide base of support;Left genu recurvatum    Ambulation Surface  Level;Indoor  Neuro Re-ed    Neuro Re-ed Details   for strengthening/muscle re-ed: in tall kneeling on red mat- mini squats with simultaneous chest press with 2# ball for 2 sets of 10 reps; childs pose for 20 sec holds for 3 reps; cat/camel for 5 reps with max multimodal cues for correct form/technique; in tall kneeling- diagonal 2# weighted ball movements up over shoulder/down to mat on opposite side for 10 reps each side; side steppping with red band resistance around knees for 1 lap each way with UE support on mat table; hamstring stretching on red mat of right LE for 30 sec's x 3          Balance Exercises - 01/04/20 1300      Balance Exercises: Standing   SLS with Vectors  Foam/compliant surface;Other reps  (comment);Limitations    SLS with Vectors Limitations  on 1 inch foam with 3 foam bubbles in small semi circle in front: foot taps laterally>forward to bubbles for 5 sets with each LE. min HHA for balance with cues for step placement and weight shifting.            PT Short Term Goals - 01/03/20 1411      PT SHORT TERM GOAL #1   Title  Pt will perform HEP with family supervision for improved balance, strength, decreased pain, gait and transfers.  TARGET for all STGs:  4 weeks, 12/28/2019    Baseline  01/03/20: pt has HEP. Continues to be inconsistent with performance.    Status  Not Met      PT SHORT TERM GOAL #2   Title  Pt will improve TUG score to less than or equal to 15 seconds for decreased fall risk    Baseline  01/03/20: 16.53 sec's with rollator, improved just not to goal    Time  --    Period  --    Status  Partially Met      PT SHORT TERM GOAL #3   Title  Pt will improve 5x sit<>stand test to less than or equal to 16 seconds to demonstrate improved functional strength.    Baseline  01/03/20: 11.85 sec's no UE support using standard height chair surface    Time  --    Period  --    Status  Achieved      PT SHORT TERM GOAL #4   Title  Pt will amb. 50-179f without device with supervision to demo improved balance with gait.    Baseline  01/03/20: able to ambulate 50 feet with no AD with min assist, met distance portion, not assist level    Time  --    Period  --    Status  Partially Met        PT Long Term Goals - 12/25/19 2215      PT LONG TERM GOAL #1   Title  Pt will perform progressive HEP with family supervision, for improved strength, balance, gait.  TARGET for all LTGs:  01/25/2020    Baseline  Not currently consistently perfroming HEP, only walking at home 11/29/2019    Time  8    Period  Weeks    Status  On-going      PT LONG TERM GOAL #2   Title  Pt will improve TUG score to less than or equal to 13.5 seconds for decreased fall risk.    Baseline  TTUG 20.09  sec at eval; 11/29/2019:  20.19 sec    Time  8  Period  Weeks    Status  On-going      PT LONG TERM GOAL #3   Title  Pt will improve gait velocity to at least 2 ft/sec for improved gait efficiency and safety.    Baseline  1.78 ft/sec at eval    Time  8    Period  Weeks    Status  On-going      PT LONG TERM GOAL #4   Title  Pt will improve Berg Balance score to at least 47/56 for decreased fall risk.    Baseline  Berg 45/56 at eval; 44/56 11/29/2019    Time  8    Period  Weeks    Status  Revised      PT LONG TERM GOAL #5   Title  Negotiate 4 steps with use of 1 rail using a step over step sequence for ascension & descension with supervision.    Baseline  Per report at eval, pt uses step-to pattern with +2 supervision and rails.; 11/29/2019:  ascending 1 rail with step-to pattern, last step up with step through pattern; descending step-to pattern    Time  8    Period  Weeks    Status  On-going            Plan - 01/04/20 1234    Clinical Impression Statement  Today's skilled session continued to focus on strengtheing and balance reactions with up to min assist for balance with standing balance tasks. Rest breaks needed due to shortness of breath, pt use inhaler once with improvement reported. The pt is progressing toward goals and should benefit from continued PT to progress toward unmet goals.    Personal Factors and Comorbidities  Comorbidity 3+;Time since onset of injury/illness/exacerbation    Comorbidities  Asthma, headaches, obesity, muscle spasms of back, COVID-19    Examination-Activity Limitations  Stand;Transfers;Locomotion Level    Examination-Participation Restrictions  Community Activity;School   travel in RV with family   Stability/Clinical Decision Making  Evolving/Moderate complexity    Rehab Potential  Good    PT Frequency  2x / week    PT Duration  8 weeks   per recert 07/25/2354   PT Treatment/Interventions  ADLs/Self Care Home Management;Aquatic  Therapy;Therapeutic exercise;Therapeutic activities;Stair training;Gait training;DME Instruction;Balance training;Neuromuscular re-education;Patient/family education    PT Next Visit Plan  work on core stability, lower extremity strength, SLS, tandem stance/gait; incorporating head turns/nods with gait;    PT Home Exercise Plan  [facility]    Consulted and Agree with Plan of Care  Patient;Family member/caregiver    Family Member Consulted  mom-[name]       Patient will benefit from skilled therapeutic intervention in order to improve the following deficits and impairments:  Abnormal gait, Decreased activity tolerance, Decreased balance, Decreased mobility, Decreased endurance, Decreased coordination, Difficulty walking, Decreased strength, Postural dysfunction, Pain, Impaired tone  Visit Diagnosis: Muscle weakness (generalized)  Other abnormalities of gait and mobility  Unsteadiness on feet     Problem List Patient Active Problem List   Diagnosis Date Noted  . Muscle spasm of back 07/14/2019  . Pyelonephritis 07/12/2019  . COVID-19 07/12/2019  . Peripheral neuropathy 08/24/2018  . Poor sleep hygiene 07/11/2018  . 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 Left hemiparesis ([facility]) 01/17/2018  . 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 Anoxic brain injury ([facility]) 09/28/2017  . 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 Spasticity 09/28/2017  . Encephalopathy 09/19/2017  .  PCOS (polycystic ovarian syndrome) 09/29/2016  . Low bone density 09/29/2016  . 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 Severe asthma with acute exacerbation 07/26/2015  . Seasonal and perennial allergic rhinitis 07/26/2015  . Obesity peds (BMI >=95 percentile) 02/29/2012    Willow Ora, PTA, [facility] [address], [place],  [address] [phone] 01/04/20, 7:50 PM   Name: TYSHEA IMEL MRN: [id number] Date  of Birth: 13-Mar-2001

## 2020-01-04 NOTE — Therapy (Signed)
[facility] [address] [place], [place], [address] Phone: [phone]   Fax:  [phone]  Speech Language Pathology Treatment  Patient Details  Name: [name] MRN: [id number] Date of Birth: [birth year] Referring Provider (SLP): Ellison Carwin, MD   Encounter Date: 01/03/2020  End of Session - 01/04/20 0858    Visit Number  3    Number of Visits  17    Date for SLP Re-Evaluation  02/16/20    Authorization Type  Medicaid (pt/ot plan for 24 sessions each)    Authorization - Visit Number  2    Authorization - Number of Visits  16    SLP Start Time  1534    SLP Stop Time   1615    SLP Time Calculation (min)  41 min    Activity Tolerance  Patient tolerated treatment well       Past Medical History:  Diagnosis Date  . Allergy   . Allergy    Multiple allergies - see allergy list  . Alopecia   . Asthma   . Asthma   . Family history of adverse reaction to anesthesia    Mom - Difficult to anesthesize/Hypotension  . Headache    With wheezing per Mom  . 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 Pyelonephritis 07/12/2019  . Vision abnormalities    Diminished vision Left Eye per Mom    Past Surgical History:  Procedure Laterality Date  . ADENOIDECTOMY    . ESOPHAGOGASTRODUODENOSCOPY (EGD) WITH PROPOFOL N/A 09/27/2017   Procedure: ESOPHAGOGASTRODUODENOSCOPY (EGD) WITH PROPOFOL;  Surgeon: Adelene Amas, MD;  Location: [facility] ENDOSCOPY;  Service: Gastroenterology;  Laterality: N/A;  . PEG PLACEMENT N/A 09/27/2017   Procedure: PERCUTANEOUS ENDOSCOPIC GASTROSTOMY (PEG) PLACEMENT;  Surgeon: Adelene Amas, MD;  Location: [facility] ENDOSCOPY;  Service: Gastroenterology;  Laterality: N/A;  . TONSILLECTOMY    . WISDOM TOOTH EXTRACTION      There were no vitals filed for this visit.  Subjective Assessment - 01/03/20 1537    Subjective  "7" (pt re: going to bed at 7am)    Patient is accompained by:  Family member   mom   Currently in Pain?  No/denies            ADULT SLP TREATMENT - 01/03/20 1541      General Information   Behavior/Cognition  Alert;Pleasant mood;Cooperative      Treatment Provided   Treatment provided  Cognitive-Linquistic      Pain Assessment   Pain Assessment  No/denies pain      Cognitive-Linquistic Treatment   Treatment focused on  Dysarthria;Apraxia    Skilled Treatment  Per mother, patient was awake all night until 7am playing video games. Patient was able to demo abdominal breathing at rest with average inhalation ~4 seconds duration. She has been practicing with the breathe 2 relax app at home. Clavicular breathing ~30%. With SLP demonstration and verbal cues, increased breath cycle to approximately 5 seconds duration. SLP added sustained phonation to abdominal breathing with pt requiring mod cues for full inhalation, increased duration of /a/. Average phonation without cues was 3.8 seconds, with cues increases to 5.1. Progressed to coordination of AB with words, then phrases. Patient named anime characters and friends with occasional mod A for slower rate, full inhalation, abdominal breathing. Usual mod A for phrases (from a video game pt plays) for coordination of AB, slower rate.      Assessment / Recommendations / Plan  Plan  Continue with current plan of care      Progression Toward Goals   Progression toward goals  Progressing toward goals       SLP Education - 01/04/20 0857    Education Details  abdominal breathing, practice words/phrases with AB    Person(s) Educated  Patient;Parent(s)    Methods  Explanation;Demonstration;Verbal cues;Handout    Comprehension  Verbalized understanding;Returned demonstration;Verbal cues required;Need further instruction       SLP Short Term Goals - 01/04/20 0859      SLP SHORT TERM GOAL #1   Title  Patient will demonstrate abdominal breathing at rest 85% accuracy over 5 minutes.    Baseline  clavicular breathing    Time  4     Period  Weeks    Status  On-going      SLP SHORT TERM GOAL #2   Title  Patient will coordination respiration and phonation at phrase level 85% accuracy x 3 sessions.    Baseline  50%    Time  4    Period  Weeks    Status  On-going      SLP SHORT TERM GOAL #3   Title  Patient will use slowed rate to compensate for dysarthria and allow additional motor processing time over 5 minute simple conversation with rare min cues.    Baseline  not currently using strategies    Time  4    Period  Weeks    Status  On-going       SLP Long Term Goals - 01/04/20 0859      SLP LONG TERM GOAL #1   Title  Patient will generate sentences with abdominal breathing 85% accuracy x3 sessions.    Baseline  0%    Time  8    Period  Weeks    Status  On-going      SLP LONG TERM GOAL #2   Title  Patient will increase breath groups to average 3+ words in 5 minute simple conversation x3 visits.    Baseline  average 1 word    Time  8    Period  Weeks    Status  On-going      SLP LONG TERM GOAL #3   Title  Patient will increase intelligibility from 70% to 85% in 5 minutes simple conversation with use of breathing techniques and compensations for dysarthria.    Baseline  75%    Time  8    Period  Weeks    Status  On-going       Plan - 01/04/20 8413    Clinical Impression Statement  Patient presents with moderate-severe ataxic dysarthria; discoordination impacts all speech subsystems, including respiration, phonation, resonance and articulation. Quivering/trembling movements noted throughout. Cognitive/processing deficits appear to negatively impact speech production; pt had more difficulty with reading tasks than in spontaneous speech. Patient and mother report priority is pt's intelligibility at this time. Patient enjoys games, anime, and communicating online with friends in [place]. She hopes to eventually complete remaining 5 HS credits at [employer], but does not have plans to do at this time. I  recommend skilled ST to address dysarthria in order to improve pt's intelligibility and increase length of utterance for simple conversations with friends and family. ST to consider assessment/treatment of cognitive-linguistic deficits but will focus on dysarthria at this time due to patient goals.    Speech Therapy Frequency  2x / week    Duration  --   8 weeks  or 17 visits   Treatment/Interventions  Language facilitation;Environmental controls;Cueing hierarchy;SLP instruction and feedback;Compensatory techniques;Cognitive reorganization;Functional tasks;Compensatory strategies;Internal/external aids;Multimodal communcation approach;Patient/family education    Potential to Achieve Goals  Good    Potential Considerations  Other (comment);Cooperation/participation level   time post onset   Consulted and Agree with Plan of Care  Patient       Patient will benefit from skilled therapeutic intervention in order to improve the following deficits and impairments:   Dysarthria and anarthria    Problem List Patient Active Problem List   Diagnosis Date Noted  . Muscle spasm of back 07/14/2019  . Pyelonephritis 07/12/2019  . COVID-19 07/12/2019  . Peripheral neuropathy 08/24/2018  . Poor sleep hygiene 07/11/2018  . 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 Left hemiparesis ([place]) 01/17/2018  . 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 Anoxic brain injury ([place]) 09/28/2017  . 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 Spasticity 09/28/2017  . Encephalopathy 09/19/2017  . PCOS (polycystic ovarian syndrome) 09/29/2016  . Low bone density 09/29/2016  . 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 Severe asthma with acute exacerbation 07/26/2015  . Seasonal and perennial allergic rhinitis 07/26/2015  . Obesity peds (BMI >=95 percentile) 02/29/2012   Deneise Lever, [place], CCC-SLP Speech-Language Pathologist  Aliene Altes 01/04/2020,  9:00 AM  [facility] [address] [place], [name], [address] Phone: [phone]   Fax:  [phone]   Name: STEVI HOLLINSHEAD MRN: [id number] Date of Birth: 10-Jan-2001

## 2020-01-07 ENCOUNTER — Ambulatory Visit: Payer: Self-pay | Admitting: Physical Therapy

## 2020-01-08 ENCOUNTER — Ambulatory Visit: Payer: Medicaid Other | Admitting: Physical Therapy

## 2020-01-08 ENCOUNTER — Ambulatory Visit: Payer: Medicaid Other | Admitting: Occupational Therapy

## 2020-01-08 ENCOUNTER — Ambulatory Visit: Payer: Medicaid Other | Admitting: Speech Pathology

## 2020-01-08 ENCOUNTER — Telehealth: Payer: Self-pay | Admitting: Allergy & Immunology

## 2020-01-08 ENCOUNTER — Other Ambulatory Visit (INDEPENDENT_AMBULATORY_CARE_PROVIDER_SITE_OTHER): Payer: Self-pay | Admitting: Pediatrics

## 2020-01-08 NOTE — Telephone Encounter (Signed)
Patient's mother called back and stated that she started having a flare up last night and it has become progressively worse today. She is having a red rash around her eyelids and on the back of her neck at her hairline. She was also complaining of her scalp itching. Her mother used the Clobetasol shampoo today prescribed back in January and it helped with the itching on her scalp but the rash still remains on the back of her neck and around her eyes. Patient's mother states that she is still using the [place] on her face and Triamcinolone on her body and that it was working for a while but now seems ineffective. Please advise.

## 2020-01-08 NOTE — Telephone Encounter (Signed)
Mom called to f/u on this request.   

## 2020-01-08 NOTE — Telephone Encounter (Signed)
Prescription for 1 month and will not be refilled if she does not keep her appointment.  Mother had a discussion with Irving Burton and was told that she had to keep this appointment or we would transfer care.

## 2020-01-08 NOTE — Telephone Encounter (Signed)
Called and left voicemail asking for patient's mother to call back to discuss.

## 2020-01-08 NOTE — Telephone Encounter (Signed)
Patients mother called stating patient is having flare-ups and was prescribed cream but it is not helping.  Patient is having a reaction on her eyelids and back of neck.  She is wanting to know if there is anything else that can be done for relief.  Mom can be reached at [phone]

## 2020-01-09 MED ORDER — PREDNISONE 10 MG PO TABS: 10.0000 mg | ORAL_TABLET | Freq: Two times a day (BID) | ORAL | 0 refills | Status: AC

## 2020-01-09 NOTE — Telephone Encounter (Signed)
Called and left a detailed voicemail per DPR permission advising of Prednisone being sent in and doing Patch testing in the future 2 weeks after finishing Prednisone. Also advised per Dr. Dellis Anes to send pictures of her flare through MyChart.

## 2020-01-09 NOTE — Telephone Encounter (Signed)
Please send in a low dose prednisone burst: 10mg  BID for five days  I think we should do some patch testing perhaps to figure out what might be causing these flares. This needs to be at least two weeks from the end of the prednisone burst.  Please have Mom send pictures.   , MD [facility]

## 2020-01-09 NOTE — Telephone Encounter (Signed)
Prescription sent in for prednisone, attempted to call patient mother to notify of prescription, left message to call clinic back.

## 2020-01-09 NOTE — Addendum Note (Signed)
Addended by: Teressa Senter on: 01/09/2020 01:57 PM   Modules accepted: Orders

## 2020-01-10 ENCOUNTER — Ambulatory Visit: Payer: Medicaid Other | Admitting: Speech Pathology

## 2020-01-10 ENCOUNTER — Ambulatory Visit: Payer: Medicaid Other | Admitting: Physical Therapy

## 2020-01-10 ENCOUNTER — Ambulatory Visit: Payer: Medicaid Other | Admitting: Occupational Therapy

## 2020-01-14 ENCOUNTER — Ambulatory Visit: Payer: Medicaid Other | Admitting: Physical Therapy

## 2020-01-15 ENCOUNTER — Ambulatory Visit: Payer: Medicaid Other | Admitting: Occupational Therapy

## 2020-01-15 ENCOUNTER — Ambulatory Visit: Payer: Medicaid Other | Admitting: Physical Therapy

## 2020-01-15 ENCOUNTER — Ambulatory Visit: Payer: Medicaid Other | Admitting: Speech Pathology

## 2020-01-17 ENCOUNTER — Ambulatory Visit: Payer: Self-pay

## 2020-01-17 ENCOUNTER — Ambulatory Visit: Payer: Medicaid Other | Admitting: Speech Pathology

## 2020-01-18 ENCOUNTER — Encounter (INDEPENDENT_AMBULATORY_CARE_PROVIDER_SITE_OTHER): Payer: Self-pay | Admitting: Pediatrics

## 2020-01-18 ENCOUNTER — Other Ambulatory Visit: Payer: Self-pay

## 2020-01-18 ENCOUNTER — Ambulatory Visit (INDEPENDENT_AMBULATORY_CARE_PROVIDER_SITE_OTHER): Payer: Medicaid Other | Admitting: Pediatrics

## 2020-01-18 VITALS — BP 90/60 | HR 60 | Ht 64.5 in | Wt 227.8 lb

## 2020-01-18 DIAGNOSIS — G931 Anoxic brain damage, not elsewhere classified: Secondary | ICD-10-CM | POA: Diagnosis not present

## 2020-01-18 DIAGNOSIS — R269 Unspecified abnormalities of gait and mobility: Secondary | ICD-10-CM

## 2020-01-18 DIAGNOSIS — R252 Cramp and spasm: Secondary | ICD-10-CM

## 2020-01-18 DIAGNOSIS — M62830 Muscle spasm of back: Secondary | ICD-10-CM

## 2020-01-18 DIAGNOSIS — G252 Other specified forms of tremor: Secondary | ICD-10-CM | POA: Diagnosis not present

## 2020-01-18 DIAGNOSIS — G8194 Hemiplegia, unspecified affecting left nondominant side: Secondary | ICD-10-CM

## 2020-01-18 DIAGNOSIS — Z72821 Inadequate sleep hygiene: Secondary | ICD-10-CM | POA: Diagnosis not present

## 2020-01-18 DIAGNOSIS — G253 Myoclonus: Secondary | ICD-10-CM

## 2020-01-18 MED ORDER — GABAPENTIN 300 MG PO CAPS: 300.0000 mg | ORAL_CAPSULE | Freq: Three times a day (TID) | ORAL | 5 refills | Status: DC

## 2020-01-18 MED ORDER — BACLOFEN 20 MG PO TABS: 20.0000 mg | ORAL_TABLET | Freq: Three times a day (TID) | ORAL | 5 refills | Status: DC

## 2020-01-18 MED ORDER — CLONAZEPAM 0.5 MG PO TABS: 1.0000 mg | ORAL_TABLET | Freq: Two times a day (BID) | ORAL | 5 refills | Status: DC

## 2020-01-18 MED ORDER — TRAZODONE HCL 50 MG PO TABS: ORAL_TABLET | ORAL | 5 refills | Status: DC

## 2020-01-18 MED ORDER — LEVETIRACETAM 500 MG PO TABS: 500.0000 mg | ORAL_TABLET | Freq: Two times a day (BID) | ORAL | 5 refills | Status: DC

## 2020-01-18 MED ORDER — AMANTADINE HCL 100 MG PO TABS: ORAL_TABLET | ORAL | 5 refills | Status: DC

## 2020-01-18 NOTE — Patient Instructions (Signed)
I am glad that you continue to make progress in every way.  When he increase the trazodone at nighttime to see if we can help you get to sleep and stay asleep.  It will help greatly if you would not nap during the day.  I would like to see you again in 6 months.  I will be happy to see you sooner based on your circumstances.

## 2020-01-18 NOTE — Progress Notes (Signed)
Patient: [name] MRN: [id number] Sex: female DOB: 2000-12-12  Provider: Ellison Carwin, MD Location of Care: [facility]  Note type: Routine return visit  History of Present Illness: Referral Source: Alena Bills, MD History from: both parents, patient and [facility] chart Chief Complaint: Post hypoxic encephalopathy  [name] is a 19 y.o. female who returns January 19, 2020 for the first time since June 13, 2019.  She suffered an out of hospital pulmonary arrest as result of asthma September 15, 2017.  She required prolonged intubation, had rhabdomyolysis with acute renal failure, had "neurologic storms" that were associated with tachycardia fever and hypertension.  She developed hyperglycemia secondary to corticosteroids.  At the end, she was severely weak, unable to sit or walk, and had severe cognitive impairment.  She made incredible progress over time and I have followed her for her neurologic complications.  She has been seen in the emergency department with seizure-like activity on a couple of occasions.  I do not think that she has epilepsy.  She was hospitalized July 11, 2019 with pyelonephritis, but coincidentally had respiratory distress and tested positive for COVID-19.  She had significant problems with back pain which may have been multifactorial from her pyelonephritis musculoskeletal pain and COVID-19.  She has missed a number of appointments since that time.  I insisted on an office visit to reassess her.  On a couple of occasions she is run out of gabapentin and with that she had significant increase in tremors in her legs.  Though she takes Harrington Challenger, she also has experienced flares of her asthma requiring prednisone which is problematic.  She has persistent problems with back pain and stiffness that seems to be worse in the morning and gets better as she moves.  She also has experienced some episodes of hypotension.  Mother has her in  occupational therapy both regular and aqua therapy each once a week she is starting speech therapy again.  She has significant problems with insomnia with poor sleep hygiene and will sometimes be up all night and sleep long hours during the day.  She is able to get up for her therapies, but then goes back to sleep.  Her mother has taken away her phone at nighttime which I think is a good idea.  Review of Systems: A complete review of systems was remarkable for patient is here to be seen for post hypoxic encephalopathy. Mom reports that the patient has not been doing well. She states that the patient has not been able to fall asleep. She is not sure if the other medication is drownng out her sleep medication. She reports the patien is having stiffness and leg shaking.  She also reports that patient is having alot of heartburn issus. No other concerns at this time., all other systems reviewed and negative.  Past Medical History Diagnosis Date  . Allergy   . Allergy    Multiple allergies - see allergy list  . Alopecia   . Asthma   . Asthma   . Family history of adverse reaction to anesthesia    Mom - Difficult to anesthesize/Hypotension  . Headache    With wheezing per Mom  . 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 Pyelonephritis 07/12/2019  . 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  , Head Injury: No., Nervous System Infections: No., Immunizations up to date: Yes.    Copied from prior chart  See history of present illness from January 17, 2018.  EEG September 17, 2017 showed 7 Hz 35 V posterior dominant rhythm, normal anterior-posterior gradient, well organized background which is continuous and symmetric. There were no interictal or ictal waveforms.  MRI of the brain without contrast September 19, 2017 is not available for review but shows bilateral posterior frontal left greater than right areas of restricted diffusion corresponding with T2 and  flair hyperintensity. There were no signal abnormalities in the deep gray matter or brainstem.  EEG September 22, 2017 showed a 5-6 Hz 40 V, and posterior rhythm with slight anterior posterior gradient and well-organized background continuous and symmetric with no interictal or ictal activity.  CPK (10/26) 2, 282, (10/27) 17,102, (10/27) 47,096, (10/27) 44,002, (10/28) 29,970, (10/28) 24,137, (10/29) 17,109, (10/29) 10,312, (10/30) 6893, (10/31) 1, 806, (11/1) 581, (11/3) 3, 619 (11/5) 2, 112  Creatinine peaked at 1.41 (10/25) and normalized by 10/28 at 0.85  Glucoses were elevated related to stress and also corticosteroids, hemoglobin A1c 5.3  Lactic acid (10/25) 8.63, (10/25) 2.6, (10/26) 8.2, (10/26) 5.5, (10/26) 2.9, (10/26) 1.4  D-dimer (10/25) 10.51, (10/27) 1.41  Free T4 1.48, TSH 2.271 (11/8)  Birth History 9 Lbs. 0 oz. infant born at [redacted] weeks gestational age to a 19 year old g 2p 001 73female. Gestation wascomplicated byHyperemesis gravidarum Mother receivedPitocin and Epidural anesthesia Primarycesarean section Nursery Course wasuncomplicated Growth and Development wasrecalled asnormal  Behavior History none  Surgical History Procedure Laterality Date  . ADENOIDECTOMY    . ESOPHAGOGASTRODUODENOSCOPY (EGD) WITH PROPOFOL N/A 09/27/2017   Procedure: ESOPHAGOGASTRODUODENOSCOPY (EGD) WITH PROPOFOL;  Surgeon: Adelene Amas, MD;  Location: [facility] ENDOSCOPY;  Service: Gastroenterology;  Laterality: N/A;  . PEG PLACEMENT N/A 09/27/2017   Procedure: PERCUTANEOUS ENDOSCOPIC GASTROSTOMY (PEG) PLACEMENT;  Surgeon: Adelene Amas, MD;  Location: [facility] ENDOSCOPY;  Service: Gastroenterology;  Laterality: N/A;  . TONSILLECTOMY    . WISDOM TOOTH EXTRACTION     Family History family history includes Asthma in her mother and paternal grandmother; Cancer in her maternal grandfather, mother, and paternal grandmother; Depression in her brother; Diabetes in her maternal grandfather  and maternal grandmother; Hypertension in her maternal grandfather and maternal grandmother; Vision loss in her mother. Family history is negative for migraines, seizures, intellectual disabilities, blindness, deafness, birth defects, chromosomal disorder, or autism.  Social History Socioeconomic History  . Marital status: Single  . Years of education:  6  . Highest education level:  High school junior  Occupational History  . Not employed  Tobacco Use  . Smoking status: Never Smoker  . Smokeless tobacco: Never Used  . Tobacco comment: no smokers in the home  Substance and Sexual Activity  . Alcohol use: No  . Drug use: No  . Sexual activity: Never    Comment: Metformin for irregular menstrual cycles  Social History Narrative    Alexas is currently not in school    Patient lives at home with mom, dad, and brother.    Allergies Allergen Reactions  . Azithromycin Anaphylaxis  . Biaxin [Clarithromycin] Anaphylaxis  . Erythromycin Anaphylaxis  . Macrolides And Ketolides Anaphylaxis  . Nitrofurantoin Monohyd Macro Anaphylaxis  . Quinolones Anaphylaxis  . Troleandomycin Anaphylaxis  . Mango Flavor Hives   Physical Exam BP 90/60   Pulse 60   Ht 5' 4.5" (1.638 m)   Wt 227 lb 12.8 oz (103.3 kg)   BMI 38.50 kg/m   General: alert, well developed, obese, in no acute distress, brown hair, brown eyes, right handed  Head: normocephalic, no dysmorphic features Ears, Nose and Throat: Otoscopic: tympanic membranes normal; pharynx: oropharynx is pink without exudates or tonsillar hypertrophy Neck: supple, full range of motion, no cranial or cervical bruits Respiratory: auscultation clear Cardiovascular: no murmurs, pulses are normal Musculoskeletal: no skeletal deformities or apparent scoliosis Skin: no rashes or neurocutaneous lesions  Neurologic Exam  Mental Status: alert; oriented to person; knowledge is low normal for age; language is below normal, but she is able to  understand what is said to her and respond in brief phrases that are coherent, usually intelligible and appropriate. Cranial Nerves: visual fields are full to double simultaneous stimuli; extraocular movements are full and conjugate; pupils are round reactive to light; funduscopic examination shows sharp disc margins with normal vessels; symmetric, impassive facial strength; midline tongue and uvula; air conduction is greater than bone conduction bilaterally Motor: normal strength, tone and mass on the right, she has mild left hemiparesis in her upper extremity with her hand in a semifisted position but actually has fairly good strength on formal testing; good fine motor movements; no pronator drift Sensory: intact responses to cold, vibration, proprioception and stereognosis Coordination: good finger-to-nose; rapid repetitive alternating movements and finger apposition are clumsy Gait and Station: broad-based, shuffling gait and station: She was able to walk independently, pivot with several steps and return to her wheelchair Reflexes: symmetric and diminished bilaterally; no clonus; bilateral flexor plantar responses  Assessment 1.  Anoxic brain injury, G93.1. 2.  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  Spasticity, R25.2. 4.  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 5.  Muscle spasms of back, M62.830. 6.  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 8.  Poor sleep hygiene, Z72.821.  Discussion Despite her problems, Elizbeth is making good progress physically and cognitively.  She needs to continue the medications that I have prescribed and I refilled those today.  Plan Prescriptions were refilled for levetiracetam, clonazepam, amantadine, gabapentin, trazodone was increased to try to help with her sleep, baclofen.  She will return to see me in 6 months time but I will be happy to see her sooner based on clinical need.  Greater than 50% of a 25-minute visit was spent in counseling and  coordination of care for the variety of issues described above.  Her polypharmacy is necessary to treat all.  We discussed the need to work on sleep hygiene by limiting her naps in the afternoon and increasing trazodone at nighttime.  She cannot have her phone after her parents go to bed.  We cannot prevent her from reading but I strongly advised her to turn off the lights and go to sleep and to have her parents not allow her to nap for long hours in the afternoon.   Medication List   Accurate as of January 18, 2020 11:59 PM. If you have any questions, ask your nurse or doctor.    albuterol 108 (90 Base) MCG/ACT inhaler Commonly known as: ProAir HFA Inhale 2 puffs into the lungs every 4 (four) hours as needed for wheezing or shortness of breath.   Amantadine HCl 100 MG tablet Take 1-1/2 tablets in the morning and 1 tablet at lunchtime What changed: See the new instructions. Changed by: Wyline Copas, MD   baclofen 20 MG tablet Commonly known as: LIORESAL Take 1 tablet (20 mg total) by mouth 3 (three) times daily.   Benralizumab 30 MG/ML Sosy Commonly known as: Fasenra Inject 30 mg into the skin every 8 (eight) weeks.   budesonide-formoterol 160-4.5 MCG/ACT inhaler Commonly  known as: Symbicort Inhale 2 puffs into the lungs 2 (two) times daily.   cetirizine 10 MG tablet Commonly known as: ZYRTEC Take 10 mg by mouth daily.   Clobetasol Propionate 0.05 % shampoo Apply 1 application topically 2 (two) times daily. What changed:   when to take this  reasons to take this   clobetasol ointment 0.05 % Commonly known as: TEMOVATE Apply 1 application topically 2 (two) times daily. What changed:   when to take this  reasons to take this   Clobetasol Propionate 0.05 % shampoo Shampoo twice daily for two weeks What changed: Another medication with the same name was changed. Make sure you understand how and when to take each.   Clobex 0.05 % shampoo Generic drug: Clobetasol  Propionate Apply 1 application topically 2 (two) times daily. What changed: Another medication with the same name was changed. Make sure you understand how and when to take each.   clonazePAM 0.5 MG tablet Commonly known as: KLONOPIN Take 2 tablets (1 mg total) by mouth 2 (two) times daily.   cloNIDine 0.1 MG tablet Commonly known as: CATAPRES TAKE ONE TABLET BY MOUTH TWICE A DAY *MAKE APPT FOR FURTHER REFILLS*   EPINEPHrine 0.3 mg/0.3 mL Soaj injection Commonly known as: EPI-PEN Inject 0.3 mLs (0.3 mg total) into the muscle once as needed for anaphylaxis.   Eucrisa 2 % Oint Generic drug: Crisaborole Apply 1 application topically 2 (two) times daily.   Flovent HFA 110 MCG/ACT inhaler Generic drug: fluticasone INHALE TWO PUFFS BY MOUTH INTO THE LUNGS TWICE A DAY   gabapentin 300 MG capsule Commonly known as: NEURONTIN Take 1 capsule (300 mg total) by mouth 3 (three) times daily.   ipratropium 0.02 % nebulizer solution Commonly known as: ATROVENT Inhale 0.5 mg into the lungs every 6 (six) hours as needed for wheezing or shortness of breath.   ketoconazole 2 % shampoo Commonly known as: NIZORAL Shampoo every 3-4 days for 8 weeks, then use prn What changed:   how much to take  how to take this  when to take this  additional instructions   levETIRAcetam 500 MG tablet Commonly known as: KEPPRA Take 1 tablet (500 mg total) by mouth 2 (two) times daily.   levocetirizine 5 MG tablet Commonly known as: XYZAL TAKE ONE TABLET BY MOUTH EVERY EVENING   metFORMIN 500 MG tablet Commonly known as: GLUCOPHAGE TAKE 1 TABLET (500 MG TOTAL) BY MOUTH DAILY.   methocarbamol 500 MG tablet Commonly known as: Robaxin Take 1 tablet (500 mg total) by mouth once as needed for up to 2 doses for muscle spasms.   montelukast 10 MG tablet Commonly known as: SINGULAIR TAKE ONE TABLET BY MOUTH AT BEDTIME   omeprazole 20 MG capsule Commonly known as: PRILOSEC TAKE ONE CAPSULE BY MOUTH  DAILY   traZODone 50 MG tablet Commonly known as: DESYREL Take 2 tablets before bedtime What changed:   how much to take  how to take this  when to take this  additional instructions Changed by: Ellison Carwin, MD   triamcinolone ointment 0.1 % Commonly known as: KENALOG Apply 1 application topically 2 (two) times daily.    The medication list was reviewed and reconciled. All changes or newly prescribed medications were explained.  A complete medication list was provided to the patient/caregiver.  Deetta Perla MD

## 2020-01-21 ENCOUNTER — Ambulatory Visit: Payer: Medicaid Other | Attending: Pediatrics | Admitting: Physical Therapy

## 2020-01-21 ENCOUNTER — Other Ambulatory Visit: Payer: Self-pay

## 2020-01-21 ENCOUNTER — Encounter: Payer: Self-pay | Admitting: Physical Therapy

## 2020-01-21 DIAGNOSIS — M6281 Muscle weakness (generalized): Secondary | ICD-10-CM | POA: Diagnosis not present

## 2020-01-21 DIAGNOSIS — R41841 Cognitive communication deficit: Secondary | ICD-10-CM | POA: Insufficient documentation

## 2020-01-21 DIAGNOSIS — R2681 Unsteadiness on feet: Secondary | ICD-10-CM | POA: Diagnosis present

## 2020-01-21 DIAGNOSIS — R2689 Other abnormalities of gait and mobility: Secondary | ICD-10-CM | POA: Insufficient documentation

## 2020-01-21 DIAGNOSIS — R471 Dysarthria and anarthria: Secondary | ICD-10-CM | POA: Insufficient documentation

## 2020-01-21 NOTE — Therapy (Signed)
[facility] [address] [place], [name], [address] Phone: [phone]   Fax:  [phone]  Physical Therapy Treatment  Patient Details  Name: [name] MRN: [id number] Date of Birth: September 03, 2001 Referring Provider (PT): Dr. Wyline Copas   Encounter Date: 01/21/2020  PT End of Session - 01/21/20 2022    Visit Number  16    Number of Visits  24    Date for PT Re-Evaluation  02/27/20    Authorization Type  Medicaid    Authorization Time Period  16 visits approved from 10/08/19-12/02/2019; 16 visits approved 12/05/2019-01/29/2020    Authorization - Visit Number  7    Authorization - Number of Visits  16    PT Start Time  0034    PT Stop Time  1500    PT Time Calculation (min)  45 min    Equipment Utilized During Treatment  Other (comment)   noodle, ankle flotation cuffs for strengthening, bar bells   Activity Tolerance  Patient tolerated treatment well    Behavior During Therapy  [facility] for tasks assessed/performed       Past Medical History:  Diagnosis Date  . Allergy   . Allergy    Multiple allergies - see allergy list  . Alopecia   . Asthma   . Asthma   . Family history of adverse reaction to anesthesia    Mom - Difficult to anesthesize/Hypotension  . Headache    With wheezing per Mom  . 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 Pyelonephritis 07/12/2019  . Vision abnormalities    Diminished vision Left Eye per Mom    Past Surgical History:  Procedure Laterality Date  . ADENOIDECTOMY    . ESOPHAGOGASTRODUODENOSCOPY (EGD) WITH PROPOFOL N/A 09/27/2017   Procedure: ESOPHAGOGASTRODUODENOSCOPY (EGD) WITH PROPOFOL;  Surgeon: Joycelyn Rua, MD;  Location: [facility];  Service: Gastroenterology;  Laterality: N/A;  . PEG PLACEMENT N/A 09/27/2017   Procedure: PERCUTANEOUS ENDOSCOPIC GASTROSTOMY (PEG) PLACEMENT;  Surgeon: Joycelyn Rua, MD;  Location: [facility];  Service: Gastroenterology;  Laterality: N/A;   . TONSILLECTOMY    . WISDOM TOOTH EXTRACTION      There were no vitals filed for this visit.  Subjective Assessment - 01/21/20 2020    Subjective  Pt presents for aquatic therapy at [facility] accompanied by her mother; mother states pt was sick with stomach bug for several days last week but states she has recovered from it and is feeling much better now    Patient is accompained by:  Family member   mom   Pertinent History  hypoxic brain injury due to cardiac arrest on 09-15-17 due to severe asthma attack    Patient Stated Goals  Improve balance and core (mom); pt wants to walk without rollator    Currently in Pain?  No/denies           Aquatic therapy at [facility]: pool temp 87.4 degrees  Patient seen for aquatic therapy today.  Treatment took place in water 3.5-4 feet deep depending upon activity.  Pt entered and  exited the pool via ramp with use of 1 handrail.  Ambulated into/out of pool area with rollator with supervision.  Pt performed amb. 19macross pool x 3 reps forwards, 1rep sideways, backwards 1 rep; 1 rep sideways with squat with bar bells held in each hand  Pt performed marching in place 10 reps each - cues to perform slowly to improve standing balance and SLS on each  leg  Squats x 10 reps without UE support  Standing stepping single leg forward<>backward across colored tile in pool x 10 reps each leg. Pt initially needing min assist with balance but progressed to CGA after approx. 5 reps with each leg   Pt performed Ai Chi postures in standing -.  Enclosing posture x 10 reps core stabilization and balance.  Pt able to maintain squated position well with Ai Chi postures with cues to maintain upright trunk. Balancing posture 10 reps on each leg with CGA prn LOB    Pt performed jogging 88mx 1 rep across pool with SBA - no major LOB occurred with this activity  1/2 jumping jacks x 10 reps; Lunges x 10 reps with tactile cues for contralateral UE/LE movement    Pt performed  hip abduction/adduction 10 reps x 2 sets in supine position with use of noodle Pt performed bicycling LE's 242m 1 rep with mod assist to maintain flotation in supine position with use of noodle   Pt requires buoyancy of water for support and for safety with performing ballistic and coordination activities that are unable to be performed on land due to high risk of fall without buoyancy for support; pt requires the viscosity of the water for resistance with the strengthening exercises                              PT Short Term Goals - 01/21/20 2042      PT SHORT TERM GOAL #1   Title  Pt will perform HEP with family supervision for improved balance, strength, decreased pain, gait and transfers.  TARGET for all STGs:  4 weeks, 12/28/2019    Baseline  01/03/20: pt has HEP. Continues to be inconsistent with performance.    Status  Not Met      PT SHORT TERM GOAL #2   Title  Pt will improve TUG score to less than or equal to 15 seconds for decreased fall risk    Baseline  01/03/20: 16.53 sec's with rollator, improved just not to goal    Status  Partially Met      PT SHORT TERM GOAL #3   Title  Pt will improve 5x sit<>stand test to less than or equal to 16 seconds to demonstrate improved functional strength.    Baseline  01/03/20: 11.85 sec's no UE support using standard height chair surface    Status  Achieved      PT SHORT TERM GOAL #4   Title  Pt will amb. [id number] device with supervision to demo improved balance with gait.    Baseline  01/03/20: able to ambulate 50 feet with no AD with min assist, met distance portion, not assist level    Status  Partially Met        PT Long Term Goals - 01/21/20 2042      PT LONG TERM GOAL #1   Title  Pt will perform progressive HEP with family supervision, for improved strength, balance, gait.  TARGET for all LTGs:  01/25/2020    Baseline  Not currently consistently perfroming HEP, only walking at home 11/29/2019    Time   8    Period  Weeks    Status  On-going      PT LONG TERM GOAL #2   Title  Pt will improve TUG score to less than or equal to 13.5 seconds for decreased fall risk.  Baseline  TTUG 20.09 sec at eval; 11/29/2019:  20.19 sec    Time  8    Period  Weeks    Status  On-going      PT LONG TERM GOAL #3   Title  Pt will improve gait velocity to at least 2 ft/sec for improved gait efficiency and safety.    Baseline  1.78 ft/sec at eval    Time  8    Period  Weeks    Status  On-going      PT LONG TERM GOAL #4   Title  Pt will improve Berg Balance score to at least 47/56 for decreased fall risk.    Baseline  Berg 45/56 at eval; 44/56 11/29/2019    Time  8    Period  Weeks    Status  Revised      PT LONG TERM GOAL #5   Title  Negotiate 4 steps with use of 1 rail using a step over step sequence for ascension & descension with supervision.    Baseline  Per report at eval, pt uses step-to pattern with +2 supervision and rails.; 11/29/2019:  ascending 1 rail with step-to pattern, last step up with step through pattern; descending step-to pattern    Time  8    Period  Weeks    Status  On-going            Plan - 01/21/20 2024    Clinical Impression Statement  Aquatic therapy session focused on LE and core strengthening exercises, balance and coordination activities and gait training with reciprocal arm swing.  Pt had difficulty with coordination exercises with contralateral UE/LE movements and with the alternation of right and left sides.  Pt is demonstrating improved endurance with few rest breaks taken during aquatic therapy session today; pt able to perform plyometric activities with minimal LOB.    Personal Factors and Comorbidities  Comorbidity 3+;Time since onset of injury/illness/exacerbation    Comorbidities  Asthma, headaches, obesity, muscle spasms of back, COVID-19    Examination-Activity Limitations  Stand;Transfers;Locomotion Level    Examination-Participation Restrictions  Community  Activity;School   travel in RV with family   Stability/Clinical Decision Making  Evolving/Moderate complexity    Rehab Potential  Good    PT Frequency  2x / week    PT Duration  8 weeks   per recert 03/26/6567   PT Treatment/Interventions  ADLs/Self Care Home Management;Aquatic Therapy;Therapeutic exercise;Therapeutic activities;Stair training;Gait training;DME Instruction;Balance training;Neuromuscular re-education;Patient/family education    PT Next Visit Plan  begin checking LTG's as Medicaid end date is 01-29-20 (renew vs D/C)    PT [place] and Agree with Plan of Care  Patient;Family member/caregiver    Family Member Consulted  mom-[name]       Patient will benefit from skilled therapeutic intervention in order to improve the following deficits and impairments:  Abnormal gait, Decreased activity tolerance, Decreased balance, Decreased mobility, Decreased endurance, Decreased coordination, Difficulty walking, Decreased strength, Postural dysfunction, Pain, Impaired tone  Visit Diagnosis: Muscle weakness (generalized)  Other abnormalities of gait and mobility  Unsteadiness on feet     Problem List Patient Active Problem List   Diagnosis Date Noted  . Muscle spasm of back 07/14/2019  . Pyelonephritis 07/12/2019  . COVID-19 07/12/2019  . Peripheral neuropathy 08/24/2018  . Poor sleep hygiene 07/11/2018  . 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 Left hemiparesis ([place])  01/17/2018  . 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 Anoxic brain injury ([place]) 09/28/2017  . 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 Spasticity 09/28/2017  . Encephalopathy 09/19/2017  . PCOS (polycystic ovarian syndrome) 09/29/2016  . Low bone density 09/29/2016  . 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 Severe asthma with acute exacerbation 07/26/2015  . Seasonal and perennial allergic rhinitis 07/26/2015  .  Obesity peds (BMI >=95 percentile) 02/29/2012    Keundra Petrucelli, [place], PT 01/21/2020, 8:44 PM  [facility] [address] [place], [name], [address] Phone: [phone]   Fax:  [phone]  Name: ARIETTA EISENSTEIN MRN: [id number] Date of Birth: 2001-05-18

## 2020-01-22 ENCOUNTER — Ambulatory Visit: Payer: Medicaid Other | Admitting: Speech Pathology

## 2020-01-22 ENCOUNTER — Ambulatory Visit: Payer: Medicaid Other | Admitting: Physical Therapy

## 2020-01-22 DIAGNOSIS — R2681 Unsteadiness on feet: Secondary | ICD-10-CM

## 2020-01-22 DIAGNOSIS — R471 Dysarthria and anarthria: Secondary | ICD-10-CM

## 2020-01-22 DIAGNOSIS — R2689 Other abnormalities of gait and mobility: Secondary | ICD-10-CM

## 2020-01-22 DIAGNOSIS — M6281 Muscle weakness (generalized): Secondary | ICD-10-CM

## 2020-01-22 NOTE — Patient Instructions (Signed)
Keep practicing your breathing. You can try it lying down to help you feel most of the movement in your stomach instead of your upper body.   Put a hand on your stomach when you read out loud (bring a manga with you for practice next time) to remind you to take full, deep breaths. Go slow, so that you can take a full breath. If your voice quivers, try taking a deep breath and then saying it again.

## 2020-01-23 ENCOUNTER — Encounter: Payer: Self-pay | Admitting: Physical Therapy

## 2020-01-23 NOTE — Therapy (Addendum)
[facility] [address] [place], [name], [address] Phone: [phone]   Fax:  [phone]  Physical Therapy Treatment  Patient Details  Name: SINCLAIR ARRAZOLA MRN: [id number] Date of Birth: 12/20/2000 Referring Provider (PT): Dr. Wyline Copas   Encounter Date: 01/22/2020  PT End of Session - 01/23/20 1417    Visit Number  17    Number of Visits  24    Date for PT Re-Evaluation  02/27/20    Authorization Type  Medicaid    Authorization Time Period  16 visits approved from 10/08/19-12/02/2019; 16 visits approved 12/05/2019-01/29/2020    Authorization - Visit Number  8    Authorization - Number of Visits  16    PT Start Time  1450    PT Stop Time  1533    PT Time Calculation (min)  43 min    Equipment Utilized During Treatment  Gait belt   noodle, ankle flotation cuffs for strengthening, bar bells   Activity Tolerance  Patient tolerated treatment well    Behavior During Therapy  [facility] for tasks assessed/performed       Past Medical History:  Diagnosis Date  . Allergy   . Allergy    Multiple allergies - see allergy list  . Alopecia   . Asthma   . Asthma   . Family history of adverse reaction to anesthesia    Mom - Difficult to anesthesize/Hypotension  . Headache    With wheezing per Mom  . 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 Pyelonephritis 07/12/2019  . Vision abnormalities    Diminished vision Left Eye per Mom    Past Surgical History:  Procedure Laterality Date  . ADENOIDECTOMY    . ESOPHAGOGASTRODUODENOSCOPY (EGD) WITH PROPOFOL N/A 09/27/2017   Procedure: ESOPHAGOGASTRODUODENOSCOPY (EGD) WITH PROPOFOL;  Surgeon: Joycelyn Rua, MD;  Location: [facility];  Service: Gastroenterology;  Laterality: N/A;  . PEG PLACEMENT N/A 09/27/2017   Procedure: PERCUTANEOUS ENDOSCOPIC GASTROSTOMY (PEG) PLACEMENT;  Surgeon: Joycelyn Rua, MD;  Location: [facility];  Service: Gastroenterology;  Laterality: N/A;  .  TONSILLECTOMY    . WISDOM TOOTH EXTRACTION      There were no vitals filed for this visit.  Subjective Assessment - 01/23/20 1351    Subjective  Pt presents to PT accompanied by her father; mother is out of town; [place] end date is 01-29-20 - pt wishes to continue with PT on land and aquatic therapy    Patient Stated Goals  Improve balance and core (mom); pt wants to walk without rollator    Currently in Pain?  No/denies                       [facility] Adult PT Treatment/Exercise - 01/23/20 0001      Ambulation/Gait   Ambulation/Gait  Yes    Ambulation/Gait Assistance  6: Modified independent (Device/Increase time)    Ambulation/Gait Assistance Details  pt uses Rollator; amb. 100' with min hand held assist; decreased Lt initial heel contact noted in stance phase of gait     Assistive device  Rollator    Gait Pattern  Step-through pattern;Ataxic;Wide base of support;Left genu recurvatum    Ambulation Surface  Level;Indoor    Gait velocity  17.18 secs with rollator = 1.91 ft/sec    Stairs  Yes    Stair Management Technique  One rail Left;Alternating pattern;Step to pattern;Forwards   alternating forward; step to with descension   Number of  Stairs  4    Height of Stairs  6      Standardized Balance Assessment   Standardized Balance Assessment  Berg Balance Test;Timed Up and Go Test      Berg Balance Test   Sit to Stand  Able to stand without using hands and stabilize independently    Standing Unsupported  Able to stand safely 2 minutes    Sitting with Back Unsupported but Feet Supported on Floor or Stool  Able to sit safely and securely 2 minutes    Stand to Sit  Sits safely with minimal use of hands    Transfers  Able to transfer safely, minor use of hands    Standing Unsupported with Eyes Closed  Able to stand 10 seconds safely    Standing Ubsupported with Feet Together  Able to place feet together independently and stand 1 minute safely    From Standing, Reach  Forward with Outstretched Arm  Can reach confidently >25 cm (10")    From Standing Position, Pick up Object from Floor  Able to pick up shoe, needs supervision    From Standing Position, Turn to Look Behind Over each Shoulder  Looks behind one side only/other side shows less weight shift    Turn 360 Degrees  Able to turn 360 degrees safely but slowly    Standing Unsupported, Alternately Place Feet on Step/Stool  Able to complete >2 steps/needs minimal assist    Standing Unsupported, One Foot in Front  Able to plae foot ahead of the other independently and hold 30 seconds    Standing on One Leg  Tries to lift leg/unable to hold 3 seconds but remains standing independently    Total Score  45      Timed Up and Go Test   Normal TUG (seconds)  17.32   with RW:  23.75 secs without RW            PT Education - 01/23/20 1416    Education Details  Discussed LTG's and progress with pt and her father:  they request to continue PT at 2x/week with 1 visit on land and 1 visit for aquatic therapy    Person(s) Educated  Patient;Parent(s)    Methods  Explanation    Comprehension  Verbalized understanding       PT Short Term Goals - 01/23/20 1425      PT SHORT TERM GOAL #1   Title  Pt will perform HEP with family supervision for improved balance, strength, decreased pain, gait and transfers.  TARGET for all STGs:  4 weeks, 12/28/2019    Baseline  01/03/20: pt has HEP. Continues to be inconsistent with performance.    Status  Not Met      PT SHORT TERM GOAL #2   Title  Pt will improve TUG score to less than or equal to 15 seconds for decreased fall risk    Baseline  01/03/20: 16.53 sec's with rollator, improved just not to goal    Status  Partially Met      PT SHORT TERM GOAL #3   Title  Pt will improve 5x sit<>stand test to less than or equal to 16 seconds to demonstrate improved functional strength.    Baseline  01/03/20: 11.85 sec's no UE support using standard height chair surface    Status   Achieved      PT SHORT TERM GOAL #4   Title  Pt will amb. 50-135f without device with supervision to demo improved  balance with gait.    Baseline  01/03/20: able to ambulate 50 feet with no AD with min assist, met distance portion, not assist level    Status  Partially Met        PT Long Term Goals - 01/22/20 1459      PT LONG TERM GOAL #1   Title  Pt will perform progressive HEP with family supervision, for improved strength, balance, gait.  TARGET for all LTGs:  01/25/2020    Baseline  pt reports occasionally doing exercises at home 1-2x/week - 01-22-20    Time  8    Period  Weeks    Status  On-going      PT LONG TERM GOAL #2   Title  Pt will improve TUG score to less than or equal to 13.5 seconds for decreased fall risk.    Baseline  17.32 with RW:  23.75 without RW -  01-22-20    Time  8    Period  Weeks    Status  Not Met      PT LONG TERM GOAL #3   Title  Pt will improve gait velocity to at least 2 ft/sec for improved gait efficiency and safety.    Baseline  17.18 = 1.91 ft/sec with RW - 01-22-20    Time  8    Period  Weeks    Status  On-going      PT LONG TERM GOAL #4   Title  Pt will improve Berg Balance score to at least 47/56 for decreased fall risk.    Baseline  Berg score 45/56 on 01-22-20    Time  8    Period  Weeks    Status  On-going      PT LONG TERM GOAL #5   Title  Negotiate 4 steps with use of 1 rail using a step over step sequence for ascension & descension with supervision.    Baseline  step over step with ascending, step to pattern with descending - 01-22-20    Time  8    Period  Weeks    Status  Partially Met      PT LONG TERM GOAL  #9   TITLE  Pt will be modified independent with household amb. without assistive device.    Baseline  pt reports walking into bathroom without rollator and also walks in kitchen without RW            Plan - 01/23/20 1418    Clinical Impression Statement  Pt has received 8/16 authorized visits from Medicaid; none of  the 5 LTG's have been fully met.  Some goals have been partially met as pt has improved but not to fully stated goals.  Pt continues to use RW for assistance with community ambulation but pt reports she is beginning to ambulate more in her home without RW with use of objects and walls prn for assistance with balance.  Berg score remains 45/56; gait velocity has increased to 1.91 ft/sec but not to 2.0 ft/sec per stated goal.    Personal Factors and Comorbidities  Comorbidity 3+;Time since onset of injury/illness/exacerbation    Comorbidities  Asthma, headaches, obesity, muscle spasms of back, COVID-19    Examination-Activity Limitations  Stand;Transfers;Locomotion Level    Examination-Participation Restrictions  Community Activity;School   travel in RV with family   Stability/Clinical Decision Making  Evolving/Moderate complexity    Rehab Potential  Good    PT Frequency  2x / week  PT Duration  8 weeks   per recert 03/25/85   PT Treatment/Interventions  ADLs/Self Care Home Management;Aquatic Therapy;Therapeutic exercise;Therapeutic activities;Stair training;Gait training;DME Instruction;Balance training;Neuromuscular re-education;Patient/family education    PT Next Visit Plan  renew for Medicaid    PT Home Exercise Plan  [facility]    Consulted and Agree with Plan of Care  Patient;Family member/caregiver    Family Member Consulted  mom-[name]       Patient will benefit from skilled therapeutic intervention in order to improve the following deficits and impairments:  Abnormal gait, Decreased activity tolerance, Decreased balance, Decreased mobility, Decreased endurance, Decreased coordination, Difficulty walking, Decreased strength, Postural dysfunction, Pain, Impaired tone  Visit Diagnosis: Other abnormalities of gait and mobility  Muscle weakness (generalized)  Unsteadiness on feet     Problem List Patient Active Problem List   Diagnosis Date Noted  . Muscle spasm of back 07/14/2019   . Pyelonephritis 07/12/2019  . COVID-19 07/12/2019  . Peripheral neuropathy 08/24/2018  . Poor sleep hygiene 07/11/2018  . 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 Left hemiparesis ([facility]) 01/17/2018  . 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 Anoxic brain injury ([facility]) 09/28/2017  . 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 Spasticity 09/28/2017  . Encephalopathy 09/19/2017  . PCOS (polycystic ovarian syndrome) 09/29/2016  . Low bone density 09/29/2016  . 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 Severe asthma with acute exacerbation 07/26/2015  . Seasonal and perennial allergic rhinitis 07/26/2015  . Obesity peds (BMI >=95 percentile) 02/29/2012    Dalis Beers, [place], PT 01/23/2020, 2:28 PM  [facility] [address] [place], [name], [address] Phone: [phone]   Fax:  [phone]  Name: ROE KOFFMAN MRN: [id number] Date of Birth: 2001-06-11  New goals for recert: PT Short Term Goals - 01/30/20 1020      PT SHORT TERM GOAL #1   Title  Pt will perform HEP with family supervision for improved balance, strength, decreased pain, gait and transfers.  Updated TARGET for all STGs:  4 weeks, (02/29/2020) (may be delayed due to scheduling)    Baseline  01/22/2020: pt has progressions of HEP. Not able to fully assess due to pt's inconsistent attendance due to illness.    Time  4    Status  Revised      PT SHORT TERM GOAL #2   Title  Pt will improve TUG score to less than or equal to 15 seconds for decreased fall risk    Baseline  01/22/2020:  17.32 sec with rollator    Time  4    Status  Revised      PT SHORT TERM GOAL #3   Title  Pt will amb. 50-143f without device with supervision to demo improved balance with gait.    Baseline  01/03/20: able to ambulate 50 feet with no AD with min assist, met distance portion, not assist level    Time  4    Period   Weeks    Status  On-going      PT Long Term Goals - 01/30/20 1024      PT LONG TERM GOAL #1   Title  Pt will perform progressive HEP  with family supervision, for improved strength, balance, gait.  Updated TARGET for all LTGs:  03/28/2020    Baseline  pt reports occasionally doing exercises at home 1-2x/week - 01-22-20    Time  8    Period  Weeks    Status  On-going      PT LONG TERM GOAL #2   Title  Pt will improve TUG score to less than or equal to 13.5 seconds for decreased fall risk.    Baseline  17.32 with RW:  23.75 without RW -  01-22-20    Time  8    Period  Weeks    Status  On-going      PT LONG TERM GOAL #3   Title  Pt will improve gait velocity to at least 2 ft/sec for improved gait efficiency and safety.    Baseline  17.18 = 1.91 ft/sec with RW - 01-22-20    Time  8    Period  Weeks    Status  On-going      PT LONG TERM GOAL #4   Title  Pt will improve Berg Balance score to at least 47/56 for decreased fall risk.    Baseline  Berg score 45/56 on 01-22-20    Time  8    Period  Weeks    Status  On-going      PT LONG TERM GOAL #5   Title  Pt/family will verbalize plans for ongoing aquatic fitness with community pool, with aquatics HEP.    Baseline  pt attending aquatic therapy 1x/wk and HEP to be provided    Time  8    Period  Weeks    Status  Revised      PT LONG TERM GOAL #6   Title  Pt will be modified independent with household amb. without assistive device.    Baseline  pt reports walking into bathroom without rollator and also walks in kitchen without RW (01/22/2020)    Time  8    Period  Weeks    Status  New      See clinical impression statement in plan above-pt has missed multiple appointments due to illness, is making slow, steady progress towards goals, has just not met to goal level.  Pt would benefit from continued skilled PT to address strength, balance, gait training towards improved functional mobility and independence in the home.  Recommend addending  POC for recert for 2x/wk for 8 weeks.  Mady Haagensen, PT 01/30/20 10:30 AM Phone: [phone] Fax: [phone]

## 2020-01-23 NOTE — Therapy (Signed)
[facility] [address] [place], [name], [address] Phone: [phone]   Fax:  [phone]  Speech Language Pathology Treatment  Patient Details  Name: [name] MRN: [id number] Date of Birth: 12-Jun-2001 Referring Provider (SLP): Wyline Copas, MD   Encounter Date: 01/22/2020  End of Session - 01/23/20 0710    Visit Number  4    Number of Visits  17    Date for SLP Re-Evaluation  02/16/20    Authorization Type  Medicaid (pt/ot plan for 24 sessions each)    Authorization - Visit Number  3    Authorization - Number of Visits  16    SLP Start Time  5638    SLP Stop Time   7564    SLP Time Calculation (min)  43 min    Activity Tolerance  Patient tolerated treatment well       Past Medical History:  Diagnosis Date  . Allergy   . Allergy    Multiple allergies - see allergy list  . Alopecia   . Asthma   . Asthma   . Family history of adverse reaction to anesthesia    Mom - Difficult to anesthesize/Hypotension  . Headache    With wheezing per Mom  . 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 Pyelonephritis 07/12/2019  . Vision abnormalities    Diminished vision Left Eye per Mom    Past Surgical History:  Procedure Laterality Date  . ADENOIDECTOMY    . ESOPHAGOGASTRODUODENOSCOPY (EGD) WITH PROPOFOL N/A 09/27/2017   Procedure: ESOPHAGOGASTRODUODENOSCOPY (EGD) WITH PROPOFOL;  Surgeon: Joycelyn Rua, MD;  Location: [facility];  Service: Gastroenterology;  Laterality: N/A;  . PEG PLACEMENT N/A 09/27/2017   Procedure: PERCUTANEOUS ENDOSCOPIC GASTROSTOMY (PEG) PLACEMENT;  Surgeon: Joycelyn Rua, MD;  Location: [facility];  Service: Gastroenterology;  Laterality: N/A;  . TONSILLECTOMY    . WISDOM TOOTH EXTRACTION      There were no vitals filed for this visit.  Subjective Assessment - 01/22/20 1404    Subjective  "A stomach flu. And a fever." Pt has missed recent appointments.    Patient is accompained  by:  Family member   dad   Currently in Pain?  No/denies            ADULT SLP TREATMENT - 01/23/20 0705      General Information   Behavior/Cognition  Alert;Pleasant mood;Cooperative      Treatment Provided   Treatment provided  Cognitive-Linquistic      Pain Assessment   Pain Assessment  No/denies pain      Cognitive-Linquistic Treatment   Treatment focused on  Dysarthria;Apraxia    Skilled Treatment  Patient presents with father today after missing (cancelled) several appointments due to illness, eczema. Reports she is continuing to practice abdominal breathing using the breathe2relax app. Pt reports she "didn't really" go to bed last night and appeared fatigued, yawning throughout session today. Attempted abdominal breathing with average 5 second duration for inhale/exhale, although pt with mixed success; tactile cues necessary to reduce shallow/ clavicular breathing. Pt read her list of phrases/sentences without AB or breath pacing, so SLP modeled how to do this and pt able to imitate with occasional mod A. Encouraged pt to practice AB in supine position at home to increase awareness. Targeted breath pacing in sentence level reading task re: video game news. Patient required occasional mod cues.       Assessment / Recommendations / Plan   Plan  Continue with current plan of care      Progression Toward Goals   Progression toward goals  Progressing toward goals       SLP Education - 01/23/20 0710    Education Details  breath pacing, practice AB supine    Person(s) Educated  Patient;Parent(s)    Methods  Explanation    Comprehension  Verbalized understanding       SLP Short Term Goals - 01/23/20 6834      SLP SHORT TERM GOAL #1   Title  Patient will demonstrate abdominal breathing at rest 85% accuracy over 5 minutes.    Baseline  clavicular breathing    Time  3    Period  Weeks    Status  On-going      SLP SHORT TERM GOAL #2   Title  Patient will coordination  respiration and phonation at phrase level 85% accuracy x 3 sessions.    Baseline  50%    Time  3    Period  Weeks    Status  On-going      SLP SHORT TERM GOAL #3   Title  Patient will use slowed rate to compensate for dysarthria and allow additional motor processing time over 5 minute simple conversation with rare min cues.    Baseline  not currently using strategies    Time  3    Period  Weeks    Status  On-going       SLP Long Term Goals - 01/23/20 1962      SLP LONG TERM GOAL #1   Title  Patient will generate sentences with abdominal breathing 85% accuracy x3 sessions.    Baseline  0%    Time  7    Period  Weeks    Status  On-going      SLP LONG TERM GOAL #2   Title  Patient will increase breath groups to average 3+ words in 5 minute simple conversation x3 visits.    Baseline  average 1 word    Time  7    Period  Weeks    Status  On-going      SLP LONG TERM GOAL #3   Title  Patient will increase intelligibility from 70% to 85% in 5 minutes simple conversation with use of breathing techniques and compensations for dysarthria.    Baseline  75%    Time  7    Period  Weeks    Status  On-going       Plan - 01/23/20 0711    Clinical Impression Statement  Patient presents with moderate-severe ataxic dysarthria; discoordination impacts all speech subsystems, including respiration, phonation, resonance and articulation. Quivering/trembling movements noted throughout. Cognitive/processing deficits appear to negatively impact speech production; pt had more difficulty with reading tasks than in spontaneous speech. Patient and mother report priority is pt's intelligibility at this time. Patient enjoys games, anime, and communicating online with friends in [place]. She hopes to eventually complete remaining 5 HS credits at [employer], but does not have plans to do at this time. I recommend skilled ST to address dysarthria in order to improve pt's intelligibility and increase length  of utterance for simple conversations with friends and family. ST to consider assessment/treatment of cognitive-linguistic deficits but will focus on dysarthria at this time due to patient goals.    Speech Therapy Frequency  2x / week    Duration  --   8 weeks or 17 visits   Treatment/Interventions  Language facilitation;Environmental  controls;Cueing hierarchy;SLP instruction and feedback;Compensatory techniques;Cognitive reorganization;Functional tasks;Compensatory strategies;Internal/external aids;Multimodal communcation approach;Patient/family education    Potential to Achieve Goals  Good    Potential Considerations  Other (comment);Cooperation/participation level   time post onset   Consulted and Agree with Plan of Care  Patient       Patient will benefit from skilled therapeutic intervention in order to improve the following deficits and impairments:   Dysarthria and anarthria    Problem List Patient Active Problem List   Diagnosis Date Noted  . Muscle spasm of back 07/14/2019  . Pyelonephritis 07/12/2019  . COVID-19 07/12/2019  . Peripheral neuropathy 08/24/2018  . Poor sleep hygiene 07/11/2018  . 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 Left hemiparesis ([facility]) 01/17/2018  . 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 Anoxic brain injury ([facility]) 09/28/2017  . 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 Spasticity 09/28/2017  . Encephalopathy 09/19/2017  . PCOS (polycystic ovarian syndrome) 09/29/2016  . Low bone density 09/29/2016  . 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 Severe asthma with acute exacerbation 07/26/2015  . Seasonal and perennial allergic rhinitis 07/26/2015  . Obesity peds (BMI >=95 percentile) 02/29/2012   Rondel Baton, MS, CCC-SLP Speech-Language Pathologist   Arlana Lindau 01/23/2020, 7:13 AM  [facility] [address] [place], [place], [address] Phone: [phone]   Fax:  [phone]   Name: [name] MRN: [id number] Date of Birth: 20-Aug-2001

## 2020-01-24 ENCOUNTER — Ambulatory Visit: Payer: Medicaid Other | Admitting: Occupational Therapy

## 2020-01-24 ENCOUNTER — Other Ambulatory Visit (INDEPENDENT_AMBULATORY_CARE_PROVIDER_SITE_OTHER): Payer: Self-pay | Admitting: Pediatrics

## 2020-01-24 ENCOUNTER — Ambulatory Visit: Payer: Medicaid Other | Admitting: Speech Pathology

## 2020-01-28 ENCOUNTER — Ambulatory Visit: Payer: Self-pay

## 2020-01-28 ENCOUNTER — Ambulatory Visit: Payer: Medicaid Other | Admitting: Physical Therapy

## 2020-01-29 ENCOUNTER — Ambulatory Visit: Payer: Medicaid Other | Admitting: Speech Pathology

## 2020-01-30 NOTE — Addendum Note (Signed)
Addended by: Gean Maidens on: 01/30/2020 10:35 AM   Modules accepted: Orders

## 2020-01-31 ENCOUNTER — Ambulatory Visit: Payer: Medicaid Other | Admitting: Speech Pathology

## 2020-02-04 ENCOUNTER — Ambulatory Visit: Payer: Self-pay | Admitting: Physical Therapy

## 2020-02-05 ENCOUNTER — Other Ambulatory Visit: Payer: Self-pay

## 2020-02-05 ENCOUNTER — Ambulatory Visit: Payer: Medicaid Other | Admitting: Speech Pathology

## 2020-02-05 DIAGNOSIS — R471 Dysarthria and anarthria: Secondary | ICD-10-CM

## 2020-02-05 DIAGNOSIS — M6281 Muscle weakness (generalized): Secondary | ICD-10-CM | POA: Diagnosis not present

## 2020-02-05 NOTE — Patient Instructions (Addendum)
Homework: Twice a day  1. Practice abdominal breathing for 10 minutes. 2. Full breath, then say "ahhhhhhh" 10 times, long and slow and loud, like your exhale. 3. Say 10 anime characters names: loud! Full breath before each one 4. Name 10 friends or family members:Full breath before each one 5. Read out loud for 5-10 minutes: Loud, slow, full breaths. If your voice quivers, stop and take a breath and then try again.

## 2020-02-05 NOTE — Therapy (Signed)
[facility] [address] [place], [name], [address] Phone: [phone]   Fax:  [phone]  Speech Language Pathology Treatment  Patient Details  Name: [name] MRN: [id number] Date of Birth: October 10, 2001 Referring Provider (SLP): Wyline Copas, MD   Encounter Date: 02/05/2020  End of Session - 02/05/20 1607    Visit Number  5    Number of Visits  17    Date for SLP Re-Evaluation  02/16/20    Authorization Type  Medicaid (pt/ot plan for 24 sessions each)    Authorization - Visit Number  4    Authorization - Number of Visits  16    SLP Start Time  5102   pt arrived late   SLP Stop Time   1445    SLP Time Calculation (min)  37 min    Activity Tolerance  Patient tolerated treatment well       Past Medical History:  Diagnosis Date  . Allergy   . Allergy    Multiple allergies - see allergy list  . Alopecia   . Asthma   . Asthma   . Family history of adverse reaction to anesthesia    Mom - Difficult to anesthesize/Hypotension  . Headache    With wheezing per Mom  . 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 Pyelonephritis 07/12/2019  . Vision abnormalities    Diminished vision Left Eye per Mom    Past Surgical History:  Procedure Laterality Date  . ADENOIDECTOMY    . ESOPHAGOGASTRODUODENOSCOPY (EGD) WITH PROPOFOL N/A 09/27/2017   Procedure: ESOPHAGOGASTRODUODENOSCOPY (EGD) WITH PROPOFOL;  Surgeon: Joycelyn Rua, MD;  Location: [facility];  Service: Gastroenterology;  Laterality: N/A;  . PEG PLACEMENT N/A 09/27/2017   Procedure: PERCUTANEOUS ENDOSCOPIC GASTROSTOMY (PEG) PLACEMENT;  Surgeon: Joycelyn Rua, MD;  Location: [facility];  Service: Gastroenterology;  Laterality: N/A;  . TONSILLECTOMY    . WISDOM TOOTH EXTRACTION      There were no vitals filed for this visit.  Subjective Assessment - 02/05/20 1412    Subjective  "It's still kinda red," Has missed appointments since 01/22/20 due to eczema     Patient is accompained by:  Family member   mom   Currently in Pain?  No/denies            ADULT SLP TREATMENT - 02/05/20 1413      General Information   Behavior/Cognition  Alert;Cooperative;Pleasant mood      Treatment Provided   Treatment provided  Cognitive-Linquistic      Pain Assessment   Pain Assessment  No/denies pain      Cognitive-Linquistic Treatment   Treatment focused on  Dysarthria;Apraxia    Skilled Treatment  Patient with mother today; she missed 2 weeks of appointments due to eczema/nausea. When SLP asked pt what she is working on at home: "Just the breathing." She did try to talk slower with her friends: "They said when I talk slow I sound clearer." Required mod cues with AB at rest today; able to reduce chest/clavicular breathing which is still present ~30-40% of the time. Added phonation with sustained /a/ with MPT average 8 seconds; increased to 9.4 with cues for increased effort/intensity. SLP reviewed with pt how she should be practicing her speech each day and provided new handout. Success with coordinating breath/phonation at phrase and sentence level 90% of the time in structured tasks. Progressed to paragraph reading with occasional min-mod cues. Continues to stay up and night and sleep  most of the day per mother. SLP reinforced importance of regular schedule and sleep on brain recovery.      Assessment / Recommendations / Plan   Plan  Continue with current plan of care      Progression Toward Goals   Progression toward goals  Progressing toward goals   slow progress, (attendance/participation/carryover at home)        SLP Short Term Goals - 02/05/20 1439      SLP SHORT TERM GOAL #1   Title  Patient will demonstrate abdominal breathing at rest 85% accuracy over 5 minutes.    Baseline  clavicular breathing    Time  2    Period  Weeks    Status  On-going      SLP SHORT TERM GOAL #2   Title  Patient will coordination respiration and phonation  at phrase level 85% accuracy x 3 sessions.    Baseline  02/05/20    Time  2    Period  Weeks    Status  On-going      SLP SHORT TERM GOAL #3   Title  Patient will use slowed rate to compensate for dysarthria and allow additional motor processing time over 5 minute simple conversation with rare min cues.    Baseline  not currently using strategies    Time  2    Period  Weeks    Status  On-going       SLP Long Term Goals - 02/05/20 1609      SLP LONG TERM GOAL #1   Title  Patient will generate sentences with abdominal breathing 85% accuracy x3 sessions.    Baseline  0%    Time  6    Period  Weeks    Status  On-going      SLP LONG TERM GOAL #2   Title  Patient will increase breath groups to average 3+ words in 5 minute simple conversation x3 visits.    Baseline  average 1 word    Time  6    Period  Weeks    Status  On-going      SLP LONG TERM GOAL #3   Title  Patient will increase intelligibility from 70% to 85% in 5 minutes simple conversation with use of breathing techniques and compensations for dysarthria.    Baseline  75%    Time  6    Period  Weeks    Status  On-going       Plan - 02/05/20 1607    Clinical Impression Statement  Patient presents with moderate-severe ataxic dysarthria; discoordination impacts all speech subsystems, including respiration, phonation, resonance and articulation. Patient used compensations in structured sentence tasks today for ~90% intelligibility. Progress has been somewhat limited by poor attendance and suspect limited participation/carryover at home. I recommend skilled ST to address dysarthria in order to improve pt's intelligibility and increase length of utterance for simple conversations with friends and family. ST to consider assessment/treatment of cognitive-linguistic deficits but will focus on dysarthria at this time due to patient goals.    Speech Therapy Frequency  2x / week    Duration  --   8 weeks or 17 visits    Treatment/Interventions  Language facilitation;Environmental controls;Cueing hierarchy;SLP instruction and feedback;Compensatory techniques;Cognitive reorganization;Functional tasks;Compensatory strategies;Internal/external aids;Multimodal communcation approach;Patient/family education    Potential to Achieve Goals  Good    Potential Considerations  Other (comment);Cooperation/participation level   time post onset   Consulted and Agree with Plan of Care  Patient       Patient will benefit from skilled therapeutic intervention in order to improve the following deficits and impairments:   Dysarthria and anarthria    Problem List Patient Active Problem List   Diagnosis Date Noted  . Muscle spasm of back 07/14/2019  . Pyelonephritis 07/12/2019  . COVID-19 07/12/2019  . Peripheral neuropathy 08/24/2018  . Poor sleep hygiene 07/11/2018  . 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 Left hemiparesis (HCC) 01/17/2018  . 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 Anoxic brain injury (HCC) 09/28/2017  . 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 Spasticity 09/28/2017  . Encephalopathy 09/19/2017  . PCOS (polycystic ovarian syndrome) 09/29/2016  . Low bone density 09/29/2016  . 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 Severe asthma with acute exacerbation 07/26/2015  . Seasonal and perennial allergic rhinitis 07/26/2015  . Obesity peds (BMI >=95 percentile) 02/29/2012   Rondel Baton, MS, CCC-SLP Speech-Language Pathologist   Arlana Lindau 02/05/2020, 4:10 PM   [facility] [address] [place], [place], [address] Phone: [phone]   Fax:  [phone]   Name: [name] MRN: [id number] Date of Birth: 07-22-2001

## 2020-02-07 ENCOUNTER — Ambulatory Visit (INDEPENDENT_AMBULATORY_CARE_PROVIDER_SITE_OTHER): Payer: Medicaid Other | Admitting: *Deleted

## 2020-02-07 ENCOUNTER — Other Ambulatory Visit: Payer: Self-pay

## 2020-02-07 ENCOUNTER — Ambulatory Visit: Payer: Medicaid Other | Admitting: Speech Pathology

## 2020-02-07 DIAGNOSIS — J4550 Severe persistent asthma, uncomplicated: Secondary | ICD-10-CM | POA: Diagnosis not present

## 2020-02-07 DIAGNOSIS — R41841 Cognitive communication deficit: Secondary | ICD-10-CM

## 2020-02-07 DIAGNOSIS — R471 Dysarthria and anarthria: Secondary | ICD-10-CM

## 2020-02-07 DIAGNOSIS — M6281 Muscle weakness (generalized): Secondary | ICD-10-CM | POA: Diagnosis not present

## 2020-02-08 ENCOUNTER — Ambulatory Visit: Payer: Self-pay

## 2020-02-08 NOTE — Therapy (Signed)
[facility] [address] [place], [place], [address] Phone: [phone]   Fax:  [phone]  Speech Language Pathology Treatment  Patient Details  Name: [name] MRN: [id number] Date of Birth: 02/19/2001 Referring Provider (SLP): Ellison Carwin, MD   Encounter Date: 02/07/2020  End of Session - 02/08/20 1151    Visit Number  6    Number of Visits  17    Date for SLP Re-Evaluation  02/16/20    Authorization Type  Medicaid (pt/ot plan for 24 sessions each)    Authorization - Visit Number  5    Authorization - Number of Visits  16    SLP Start Time  1407   late check in   SLP Stop Time   1445    SLP Time Calculation (min)  38 min       Past Medical History:  Diagnosis Date  . Allergy   . Allergy    Multiple allergies - see allergy list  . Alopecia   . Asthma   . Asthma   . Family history of adverse reaction to anesthesia    Mom - Difficult to anesthesize/Hypotension  . Headache    With wheezing per Mom  . 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 Pyelonephritis 07/12/2019  . Vision abnormalities    Diminished vision Left Eye per Mom    Past Surgical History:  Procedure Laterality Date  . ADENOIDECTOMY    . ESOPHAGOGASTRODUODENOSCOPY (EGD) WITH PROPOFOL N/A 09/27/2017   Procedure: ESOPHAGOGASTRODUODENOSCOPY (EGD) WITH PROPOFOL;  Surgeon: Adelene Amas, MD;  Location: [facility] ENDOSCOPY;  Service: Gastroenterology;  Laterality: N/A;  . PEG PLACEMENT N/A 09/27/2017   Procedure: PERCUTANEOUS ENDOSCOPIC GASTROSTOMY (PEG) PLACEMENT;  Surgeon: Adelene Amas, MD;  Location: [facility] ENDOSCOPY;  Service: Gastroenterology;  Laterality: N/A;  . TONSILLECTOMY    . WISDOM TOOTH EXTRACTION      There were no vitals filed for this visit.  Subjective Assessment - 02/07/20 1409    Subjective  "She's got that rash again."    Patient is accompained by:  Family member   mom   Currently in Pain?  No/denies             ADULT SLP TREATMENT - 02/07/20 1410      General Information   Behavior/Cognition  Alert;Cooperative;Pleasant mood      Treatment Provided   Treatment provided  Cognitive-Linquistic      Pain Assessment   Pain Assessment  No/denies pain      Cognitive-Linquistic Treatment   Treatment focused on  Dysarthria;Apraxia    Skilled Treatment  Patient had not practiced since her session on Tuesday. Mother reports pt continues to sleep during the day and stays awake at night on her phone. SLP continues to educate pt/mother re: sleep hygiene and regular schedule which are important for brain injury recovery. Mother reports she "tried" taking pt's phone at night but pt likes to listen to music when she sleeps. SLP suggested using an MP3 player without internet capabilities and mother stated she would look into this. SLP generated speech exercise log and daily schedule template for pt. Pt and mother to work on adding opportunities for speech/OT/PT exercises during the day and bring next session. SLP explained that without practice or carryover at home, pt will not achieve progress needed to justify ongoing ST.       Assessment / Recommendations / Plan   Plan  Continue with current plan of care  consider therapy hold/d/c if participation does not improve     Progression Toward Goals   Progression toward goals  Not progressing toward goals (comment)       SLP Education - 02/08/20 1151    Education Details  regular practice needed outside of ST or pt will not see any gains; importance of sleep hygiene and regular schedule for brain injury recovery    Person(s) Educated  Patient;Parent(s)    Methods  Explanation;Handout    Comprehension  Verbalized understanding;Need further instruction       SLP Short Term Goals - 02/08/20 1153      SLP SHORT TERM GOAL #1   Title  Patient will demonstrate abdominal breathing at rest 85% accuracy over 5 minutes.    Baseline  clavicular breathing     Time  2    Period  Weeks    Status  On-going      SLP SHORT TERM GOAL #2   Title  Patient will coordination respiration and phonation at phrase level 85% accuracy x 3 sessions.    Baseline  02/05/20    Time  2    Period  Weeks    Status  On-going      SLP SHORT TERM GOAL #3   Title  Patient will use slowed rate to compensate for dysarthria and allow additional motor processing time over 5 minute simple conversation with rare min cues.    Baseline  not currently using strategies    Time  2    Period  Weeks    Status  On-going       SLP Long Term Goals - 02/08/20 1153      SLP LONG TERM GOAL #1   Title  Patient will generate sentences with abdominal breathing 85% accuracy x3 sessions.    Baseline  0%    Time  6    Period  Weeks    Status  On-going      SLP LONG TERM GOAL #2   Title  Patient will increase breath groups to average 3+ words in 5 minute simple conversation x3 visits.    Baseline  average 1 word    Time  6    Period  Weeks    Status  On-going      SLP LONG TERM GOAL #3   Title  Patient will increase intelligibility from 70% to 85% in 5 minutes simple conversation with use of breathing techniques and compensations for dysarthria.    Baseline  75%    Time  6    Period  Weeks    Status  On-going       Plan - 02/08/20 1152    Clinical Impression Statement  Patient presents with moderate-severe ataxic dysarthria; discoordination impacts all speech subsystems, including respiration, phonation, resonance and articulation. Progress has been somewhat limited by poor attendance and suspect limited participation/carryover at home; discussed with pt/mother today that if participation does not improve, may need break or d/c from therapy due to lack of progress. I recommend skilled ST to address dysarthria in order to improve pt's intelligibility and increase length of utterance for simple conversations with friends and family. ST to consider assessment/treatment of  cognitive-linguistic deficits but will focus on dysarthria at this time due to patient goals.    Speech Therapy Frequency  2x / week    Duration  --   8 weeks or 17 visits   Treatment/Interventions  Language facilitation;Environmental controls;Cueing hierarchy;SLP instruction and feedback;Compensatory techniques;Cognitive reorganization;Functional  tasks;Compensatory strategies;Internal/external aids;Multimodal communcation approach;Patient/family education    Potential to Achieve Goals  Good    Potential Considerations  Other (comment);Cooperation/participation level   time post onset   Consulted and Agree with Plan of Care  Patient       Patient will benefit from skilled therapeutic intervention in order to improve the following deficits and impairments:   Dysarthria and anarthria  Cognitive communication deficit    Problem List Patient Active Problem List   Diagnosis Date Noted  . Muscle spasm of back 07/14/2019  . Pyelonephritis 07/12/2019  . COVID-19 07/12/2019  . Peripheral neuropathy 08/24/2018  . Poor sleep hygiene 07/11/2018  . 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 Left hemiparesis ([place]) 01/17/2018  . 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 Anoxic brain injury ([facility]) 09/28/2017  . 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 Spasticity 09/28/2017  . Encephalopathy 09/19/2017  . PCOS (polycystic ovarian syndrome) 09/29/2016  . Low bone density 09/29/2016  . 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 Severe asthma with acute exacerbation 07/26/2015  . Seasonal and perennial allergic rhinitis 07/26/2015  . Obesity peds (BMI >=95 percentile) 02/29/2012   Deneise Lever, [place], [place] 02/08/2020, 11:54 AM  [facility] [address] [place], [name], [address] Phone: [phone]   Fax:   [phone]   Name: [name] MRN: [id number] Date of Birth: 2001-01-09

## 2020-02-12 ENCOUNTER — Telehealth: Payer: Self-pay | Admitting: Allergy & Immunology

## 2020-02-12 ENCOUNTER — Other Ambulatory Visit: Payer: Self-pay

## 2020-02-12 ENCOUNTER — Ambulatory Visit: Payer: Medicaid Other | Admitting: Speech Pathology

## 2020-02-12 DIAGNOSIS — M6281 Muscle weakness (generalized): Secondary | ICD-10-CM | POA: Diagnosis not present

## 2020-02-12 DIAGNOSIS — R41841 Cognitive communication deficit: Secondary | ICD-10-CM

## 2020-02-12 DIAGNOSIS — R471 Dysarthria and anarthria: Secondary | ICD-10-CM

## 2020-02-12 NOTE — Patient Instructions (Addendum)
Speech Therapy Homework:  1. Practice abdominal breathing for 10 minutes. 2. Full breath, then say "ahhhhhhh" 10 times, long and slow and loud, like your exhale. 3. Say 10 anime characters: loud! Full breath before each one 4. Name 10 friends or family members: Full breath before each one                 Speech Therapy Practice  Monday Tuesday Wednesday Thursday Friday  Saturday Sunday

## 2020-02-12 NOTE — Therapy (Signed)
[facility] [address] [place], [place], [address] Phone: [phone]   Fax:  [phone]  Speech Language Pathology Treatment  Patient Details  Name: [name] MRN: [id number] Date of Birth: [birth year] Referring Provider (SLP): Ellison Carwin, MD   Encounter Date: 02/12/2020  End of Session - 02/12/20 1530    Visit Number  7    Number of Visits  17    Date for SLP Re-Evaluation  02/16/20    Authorization Type  Medicaid (pt/ot plan for 24 sessions each)    Authorization - Visit Number  6    Authorization - Number of Visits  16    SLP Start Time  1405    SLP Stop Time   1445    SLP Time Calculation (min)  40 min    Activity Tolerance  Patient limited by fatigue       Past Medical History:  Diagnosis Date  . Allergy   . Allergy    Multiple allergies - see allergy list  . Alopecia   . Asthma   . Asthma   . Family history of adverse reaction to anesthesia    Mom - Difficult to anesthesize/Hypotension  . Headache    With wheezing per Mom  . 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 Pyelonephritis 07/12/2019  . Vision abnormalities    Diminished vision Left Eye per Mom    Past Surgical History:  Procedure Laterality Date  . ADENOIDECTOMY    . ESOPHAGOGASTRODUODENOSCOPY (EGD) WITH PROPOFOL N/A 09/27/2017   Procedure: ESOPHAGOGASTRODUODENOSCOPY (EGD) WITH PROPOFOL;  Surgeon: Adelene Amas, MD;  Location: [facility] ENDOSCOPY;  Service: Gastroenterology;  Laterality: N/A;  . PEG PLACEMENT N/A 09/27/2017   Procedure: PERCUTANEOUS ENDOSCOPIC GASTROSTOMY (PEG) PLACEMENT;  Surgeon: Adelene Amas, MD;  Location: [facility] ENDOSCOPY;  Service: Gastroenterology;  Laterality: N/A;  . TONSILLECTOMY    . WISDOM TOOTH EXTRACTION      There were no vitals filed for this visit.  Subjective Assessment - 02/12/20 1410    Subjective  "I did the ten anime characters." (one day of practice since last visit)    Currently in Pain?   No/denies            ADULT SLP TREATMENT - 02/12/20 1526      General Information   Behavior/Cognition  Alert;Cooperative;Pleasant mood      Treatment Provided   Treatment provided  Cognitive-Linquistic      Pain Assessment   Pain Assessment  No/denies pain      Cognitive-Linquistic Treatment   Treatment focused on  Dysarthria;Apraxia    Skilled Treatment  Patient partially completed speech homework 1x since previous session. Mom reports they have not worked on a schedule due to other commitments. SLP had frank conversation with pt/mother that without regular practice and evidence of progress, need to consider taking a break or d/c from therapy. SLP simplified/modified pt's HEP to reduce time/duration and burden of reading due to eczema around her eyes. Reviewed HEP procedure with patient. She required usual mod cues with abdominal breathing today, due to breath hold/clavicular breathing. Provided homework log and told pt that in order to continue therapy, would need to see that she is practicing at least 50% of the time (twice before next session). SLP assisted pt with setting a labeled alarm in her phone to remind her to do this.      Assessment / Recommendations / Plan   Plan  Goals updated   downgraded  Progression Toward Goals   Progression toward goals  Goals downgraded         SLP Short Term Goals - 02/12/20 1527      SLP SHORT TERM GOAL #1   Title  Patient will demonstrate abdominal breathing at rest 85% accuracy over 5 minutes with min cues.    Baseline  clavicular breathing    Time  1    Period  Weeks    Status  Revised      SLP SHORT TERM GOAL #2   Title  Patient will coordination respiration and phonation at phrase level 85% accuracy x 3 sessions.    Baseline  02/05/20    Time  1    Period  Weeks    Status  On-going      SLP SHORT TERM GOAL #3   Title  Patient will use slowed rate to compensate for dysarthria and allow additional motor processing time  over 3 minute simple conversation with occasinal mod cues.    Baseline  not currently using strategies    Time  2    Period  Weeks    Status  Revised      SLP SHORT TERM GOAL #4   Title  Patient will report completing speech therapy HEP at least 50% of the time prescribed between sessions    Time  1    Period  Weeks    Status  New       SLP Long Term Goals - 02/12/20 1528      SLP LONG TERM GOAL #1   Title  Patient will generate sentences with abdominal breathing 85% accuracy x3 sessions.    Baseline  0%    Time  5    Period  Weeks    Status  On-going      SLP LONG TERM GOAL #2   Title  Patient will increase breath groups to average 3+ words in 5 minute simple conversation x3 visits.    Baseline  average 1 word    Time  5    Period  Weeks    Status  On-going      SLP LONG TERM GOAL #3   Title  Patient will increase intelligibility from 70% to 85% in 5 minutes simple conversation with use of breathing techniques and compensations for dysarthria.    Baseline  75%    Time  5    Period  Weeks    Status  On-going       Plan - 02/12/20 1531    Clinical Impression Statement  Patient presents with moderate-severe ataxic dysarthria; discoordination impacts all speech subsystems, including respiration, phonation, resonance and articulation. Progress has beenlimited by poor attendance and limited participation/carryover at home; patient practiced only once (partial) since SLP told pt/mother last week that break or d/c from therapy may be warranted due to lack of progress. Set alarms in pt's phone and reviewed HEP procedure today. If no improvement seen in participation, will place on hold next session. I recommend skilled ST to address dysarthria in order to improve pt's intelligibility and increase length of utterance for simple conversations with friends and family.    Speech Therapy Frequency  2x / week    Duration  --   8 weeks or 17 visits   Treatment/Interventions  Language  facilitation;Environmental controls;Cueing hierarchy;SLP instruction and feedback;Compensatory techniques;Cognitive reorganization;Functional tasks;Compensatory strategies;Internal/external aids;Multimodal communcation approach;Patient/family education    Potential to Achieve Goals  Good    Potential Considerations  Other (  comment);Cooperation/participation level   time post onset   Consulted and Agree with Plan of Care  Patient       Patient will benefit from skilled therapeutic intervention in order to improve the following deficits and impairments:   Dysarthria and anarthria  Cognitive communication deficit    Problem List Patient Active Problem List   Diagnosis Date Noted  . Muscle spasm of back 07/14/2019  . Pyelonephritis 07/12/2019  . COVID-19 07/12/2019  . Peripheral neuropathy 08/24/2018  . Poor sleep hygiene 07/11/2018  . 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 Left hemiparesis ([place]) 01/17/2018  . 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 Anoxic brain injury ([place]) 09/28/2017  . 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 Spasticity 09/28/2017  . Encephalopathy 09/19/2017  . PCOS (polycystic ovarian syndrome) 09/29/2016  . Low bone density 09/29/2016  . 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 Severe asthma with acute exacerbation 07/26/2015  . Seasonal and perennial allergic rhinitis 07/26/2015  . Obesity peds (BMI >=95 percentile) 02/29/2012   Deneise Lever, [place], CCC-SLP Speech-Language Pathologist  Aliene Altes 02/12/2020, 3:33 PM  [facility] [address] [place], [name], [address] Phone: [phone]   Fax:  [phone]   Name: [name] MRN: [id number] Date of Birth: 10/05/2001

## 2020-02-12 NOTE — Telephone Encounter (Signed)
I saw the patient in clinic last week. She was continuing to have a worsening rash. I will talk to our Referral Coordinator regarding a Dermatology referral.  Malachi Bonds, MD [facility]

## 2020-02-13 ENCOUNTER — Ambulatory Visit: Payer: Medicaid Other | Admitting: Physical Therapy

## 2020-02-14 ENCOUNTER — Ambulatory Visit: Payer: Medicaid Other | Admitting: Speech Pathology

## 2020-02-14 NOTE — Telephone Encounter (Signed)
Hey,  I am not seeing anything on record. I will place a referral over to the [facility] center. I just found out they do see patients the with Medicaid.  Thanks

## 2020-02-14 NOTE — Telephone Encounter (Signed)
I called to inform mom but her voicemail box is full.  [facility] [address] [place] [address] P: [phone]

## 2020-02-19 ENCOUNTER — Encounter: Payer: Medicaid Other | Admitting: Speech Pathology

## 2020-02-20 ENCOUNTER — Ambulatory Visit: Payer: Medicaid Other | Admitting: Physical Therapy

## 2020-02-29 ENCOUNTER — Ambulatory Visit: Payer: Medicaid Other | Attending: Pediatrics | Admitting: Physical Therapy

## 2020-02-29 ENCOUNTER — Telehealth: Payer: Self-pay | Admitting: Physical Therapy

## 2020-02-29 NOTE — Telephone Encounter (Signed)
Left message that patient did not show for PT appointment today; also given that patient has had 2 previous recent no-shows for PT (02/20/20 and 02/13/20), per our no-show policy, we will take out remaining PT appointments at this time.  Left my name and contact information for questions.  Lonia Blood, PT 02/29/20 3:06 PM Phone: [phone] Fax: [phone]

## 2020-03-04 ENCOUNTER — Ambulatory Visit: Payer: Medicaid Other | Admitting: Physical Therapy

## 2020-03-04 ENCOUNTER — Ambulatory Visit: Payer: Medicaid Other | Admitting: Occupational Therapy

## 2020-03-10 ENCOUNTER — Telehealth: Payer: Self-pay | Admitting: Allergy & Immunology

## 2020-03-10 NOTE — Telephone Encounter (Signed)
Patient mom called and said that she had not heard from [place] about her going to the skin surgery center so I told her that dee had tried but voice mail was full.so I gave her the information and phone number.  She said that Lenita allergies have been bad for the last 2 or 3 days, and she needs predisone or needs symbicort. [facility] [phone].

## 2020-03-11 ENCOUNTER — Ambulatory Visit: Payer: Medicaid Other | Admitting: Occupational Therapy

## 2020-03-11 ENCOUNTER — Ambulatory Visit: Payer: Medicaid Other | Admitting: Physical Therapy

## 2020-03-11 NOTE — Telephone Encounter (Signed)
Dr. Dellis Anes please advise. [name] are we needing to do anything about a referral to skin surgery center.

## 2020-03-12 ENCOUNTER — Other Ambulatory Visit (INDEPENDENT_AMBULATORY_CARE_PROVIDER_SITE_OTHER): Payer: Self-pay | Admitting: Pediatrics

## 2020-03-12 MED ORDER — BUDESONIDE-FORMOTEROL FUMARATE 160-4.5 MCG/ACT IN AERO: 2.0000 | INHALATION_SPRAY | Freq: Two times a day (BID) | RESPIRATORY_TRACT | 5 refills | Status: DC

## 2020-03-12 MED ORDER — PREDNISONE 10 MG PO TABS: 10.0000 mg | ORAL_TABLET | Freq: Two times a day (BID) | ORAL | 0 refills | Status: AC

## 2020-03-12 NOTE — Telephone Encounter (Signed)
I called and left a voicemail for mom to get a clear understanding on the situation with the referral.  Thanks

## 2020-03-12 NOTE — Addendum Note (Signed)
Addended by: Dollene Cleveland R on: 03/12/2020 01:33 PM   Modules accepted: Orders

## 2020-03-12 NOTE — Telephone Encounter (Signed)
I placed the referral and tried to contact mom on last week I believe. Patsy stated she gave her their office number to scheduled. Did mom tell you she still didn't have an appt? If she didn't get an appointment I will give their office a call.   Thanks

## 2020-03-12 NOTE — Telephone Encounter (Signed)
To my knowledge she has not received a call from their office. It may be better to be on the safe side and call their office.

## 2020-03-12 NOTE — Telephone Encounter (Signed)
Prescriptions have been sent in to [facility]. Called and left a detailed voicemail per DPR permission on mom's mobile number.

## 2020-03-12 NOTE — Telephone Encounter (Signed)
She is definitely needing her Symbicort. She should be taking two puffs twice daily anyway. She is not amenable to nasal sprays, but we can send prednisone 10mg  BID for five days.  , MD [facility]

## 2020-03-14 ENCOUNTER — Encounter (INDEPENDENT_AMBULATORY_CARE_PROVIDER_SITE_OTHER): Payer: Self-pay | Admitting: Pediatrics

## 2020-03-20 ENCOUNTER — Encounter: Payer: Medicaid Other | Admitting: Occupational Therapy

## 2020-03-20 ENCOUNTER — Ambulatory Visit: Payer: Medicaid Other | Admitting: Physical Therapy

## 2020-03-25 ENCOUNTER — Ambulatory Visit: Payer: Medicaid Other | Admitting: Physical Therapy

## 2020-03-25 ENCOUNTER — Encounter: Payer: Medicaid Other | Admitting: Occupational Therapy

## 2020-04-01 ENCOUNTER — Ambulatory Visit: Payer: Medicaid Other | Admitting: Physical Therapy

## 2020-04-01 ENCOUNTER — Encounter: Payer: Medicaid Other | Admitting: Occupational Therapy

## 2020-04-02 ENCOUNTER — Other Ambulatory Visit (INDEPENDENT_AMBULATORY_CARE_PROVIDER_SITE_OTHER): Payer: Self-pay | Admitting: Pediatrics

## 2020-04-03 ENCOUNTER — Ambulatory Visit (INDEPENDENT_AMBULATORY_CARE_PROVIDER_SITE_OTHER): Payer: Medicaid Other

## 2020-04-03 ENCOUNTER — Other Ambulatory Visit: Payer: Self-pay

## 2020-04-03 DIAGNOSIS — J4550 Severe persistent asthma, uncomplicated: Secondary | ICD-10-CM

## 2020-04-08 ENCOUNTER — Other Ambulatory Visit: Payer: Self-pay | Admitting: Allergy & Immunology

## 2020-04-08 ENCOUNTER — Encounter: Payer: Medicaid Other | Admitting: Occupational Therapy

## 2020-04-18 ENCOUNTER — Telehealth: Payer: Self-pay | Admitting: Allergy & Immunology

## 2020-04-18 MED ORDER — ALBUTEROL SULFATE HFA 108 (90 BASE) MCG/ACT IN AERS: 2.0000 | INHALATION_SPRAY | RESPIRATORY_TRACT | 1 refills | Status: DC | PRN

## 2020-04-18 NOTE — Telephone Encounter (Signed)
Prescription has been sent in. Called and advised to patient's mother. Patient's mother verbalized understanding.  

## 2020-04-18 NOTE — Telephone Encounter (Signed)
Patient mom called and needs to have proair inhaler called into [facility] [phone].

## 2020-05-05 ENCOUNTER — Other Ambulatory Visit: Payer: Self-pay | Admitting: Allergy & Immunology

## 2020-05-05 ENCOUNTER — Encounter: Payer: Self-pay | Admitting: Physical Therapy

## 2020-05-05 NOTE — Therapy (Signed)
[facility] [address] [place], [name], [address] Phone: [phone]   Fax:  [phone]  Patient Details  Name: [name] MRN: [id number] Date of Birth: [birth year] Referring Provider:  No ref. provider found  Encounter Date: 05/05/2020   PHYSICAL THERAPY DISCHARGE SUMMARY  Visits from Start of Care: 17   Current functional level related to goals / functional outcomes: Goals not able to be fully assessed, as pt did not return due to cancellations/no-shows after her 01/22/2020 visit.    Remaining deficits: Balance, gait   Education / Equipment: Educated in HEP progression Plan: Patient agrees to discharge.  Patient goals were not met. Patient is being discharged due to not returning since the last visit.  ?????       Alexei Doswell W. 05/05/2020, 8:21 AM Frazier Butt., PT  [facility] [address] [place], [name], [address] Phone: [phone]   Fax:  [phone]

## 2020-05-13 ENCOUNTER — Other Ambulatory Visit (INDEPENDENT_AMBULATORY_CARE_PROVIDER_SITE_OTHER): Payer: Self-pay | Admitting: Pediatrics

## 2020-05-29 ENCOUNTER — Ambulatory Visit: Payer: Self-pay

## 2020-06-09 ENCOUNTER — Ambulatory Visit: Payer: Self-pay

## 2020-06-12 ENCOUNTER — Telehealth: Payer: Self-pay

## 2020-06-12 NOTE — Telephone Encounter (Signed)
Refill request for Flovent & Symbicort received from [facility]. I did get Dr. Dellis Anes to sign the request with 1 refill per medication along with note that patient was overdue for an office visit.

## 2020-06-25 ENCOUNTER — Other Ambulatory Visit: Payer: Self-pay | Admitting: Allergy & Immunology

## 2020-06-27 ENCOUNTER — Other Ambulatory Visit: Payer: Self-pay | Admitting: Allergy & Immunology

## 2020-06-27 ENCOUNTER — Other Ambulatory Visit: Payer: Self-pay | Admitting: *Deleted

## 2020-06-27 MED ORDER — ALBUTEROL SULFATE HFA 108 (90 BASE) MCG/ACT IN AERS: 2.0000 | INHALATION_SPRAY | RESPIRATORY_TRACT | 0 refills | Status: DC | PRN

## 2020-06-27 NOTE — Telephone Encounter (Signed)
Called and spoke with mom to offer appointment for televisit this afternoon with Dr. Dellis Anes.  Mom states she is going out of town this afternoon and can't do televisit.  Per mom, she had to buy Albuterol out of pocket last month due to insurance change.  Mom does not know if medication for nebulizer is expired.  Mom wanted visit for tomorrow which I informed her the office is not open on Saturday.  Informed mom I would see if Dr. Dellis Anes was available to call her before she left out of town to discuss Albuterol and options for [facility].

## 2020-06-27 NOTE — Addendum Note (Signed)
Addended by: Alfonse Spruce on: 06/27/2020 01:52 PM   Modules accepted: Orders

## 2020-06-27 NOTE — Telephone Encounter (Signed)
Patient's mother states that patient is completely out of her Proair inhaler and the pharmacy needs a prior authorization because of insurance. Informed mother that patient is in need of an office visit, but mother states that Dr. Dellis Anes would never let patient go without an inhaler.   [employer]  Please advise.

## 2020-06-27 NOTE — Telephone Encounter (Signed)
Dr. Dellis Anes spoke with mom [facility]) and Albuterol was sent in for [facility].  Appointment has been made for follow up with Dr. Dellis Anes for 07/01/20 at 4:30 pm.  Patient will receive Fasenra injection at time of office visit.

## 2020-06-27 NOTE — Telephone Encounter (Signed)
Called and spoke to pharmacist at [facility] to obtain history of when patient has requested and picked up refills of ProAir HFA.  Patient got inhaler filled on June 3rd and July 17th.  It is too soon for patient to get ProAir filled again.  Patient did not get Fasenra injection 06/09/20 due stomach virus per mom and has not rescheduled.  Patient is past due for follow up visit with Dr. Dellis Anes and he will advise on medications and visit.

## 2020-06-27 NOTE — Telephone Encounter (Signed)
We can add her as a televisit.  If you want, we can even schedule at last of the day so I can just talk to mom on the way home.  Malachi Bonds, MD [facility]

## 2020-06-27 NOTE — Telephone Encounter (Signed)
I called [name]'s mother to discuss what was going on with her refills. Mom shares with me that she has some insurance things has changed. Any of the medicines that were written before the change to [employer] needed to be re-written.   Mom reports that the pharmacy has been faxing everything to the office and we have not been replying. She reports that she is coming in on Tuesday for a visit and Har[facility] Challenger. She only needs albuterol and she says that they have enough Symbicort to last through next Tuesday for their visit.   She is vaccinated to COVID19 [place] [place]). They have been traveling a lot recently, mostly outdoor places [employer] etc).   Malachi Bonds, MD [facility]

## 2020-07-01 ENCOUNTER — Ambulatory Visit (INDEPENDENT_AMBULATORY_CARE_PROVIDER_SITE_OTHER): Payer: Medicaid Other

## 2020-07-01 ENCOUNTER — Encounter: Payer: Self-pay | Admitting: Allergy & Immunology

## 2020-07-01 ENCOUNTER — Ambulatory Visit (INDEPENDENT_AMBULATORY_CARE_PROVIDER_SITE_OTHER): Payer: Medicaid Other | Admitting: Allergy & Immunology

## 2020-07-01 ENCOUNTER — Other Ambulatory Visit: Payer: Self-pay

## 2020-07-01 VITALS — BP 110/80 | HR 80 | Resp 18 | Ht 65.0 in

## 2020-07-01 DIAGNOSIS — J4550 Severe persistent asthma, uncomplicated: Secondary | ICD-10-CM

## 2020-07-01 DIAGNOSIS — L2089 Other atopic dermatitis: Secondary | ICD-10-CM | POA: Diagnosis not present

## 2020-07-01 DIAGNOSIS — J3089 Other allergic rhinitis: Secondary | ICD-10-CM | POA: Diagnosis not present

## 2020-07-01 DIAGNOSIS — J302 Other seasonal allergic rhinitis: Secondary | ICD-10-CM | POA: Diagnosis not present

## 2020-07-01 MED ORDER — BUDESONIDE-FORMOTEROL FUMARATE 160-4.5 MCG/ACT IN AERO: 2.0000 | INHALATION_SPRAY | Freq: Two times a day (BID) | RESPIRATORY_TRACT | 5 refills | Status: DC

## 2020-07-01 MED ORDER — ALBUTEROL SULFATE HFA 108 (90 BASE) MCG/ACT IN AERS: 2.0000 | INHALATION_SPRAY | RESPIRATORY_TRACT | 2 refills | Status: DC | PRN

## 2020-07-01 MED ORDER — FLOVENT HFA 110 MCG/ACT IN AERO: INHALATION_SPRAY | RESPIRATORY_TRACT | 5 refills | Status: DC

## 2020-07-01 MED ORDER — LEVOCETIRIZINE DIHYDROCHLORIDE 5 MG PO TABS: 5.0000 mg | ORAL_TABLET | Freq: Every evening | ORAL | 5 refills | Status: DC

## 2020-07-01 MED ORDER — MONTELUKAST SODIUM 10 MG PO TABS: 10.0000 mg | ORAL_TABLET | Freq: Every day | ORAL | 5 refills | Status: DC

## 2020-07-01 NOTE — Patient Instructions (Addendum)
1. Allergic rhinoconjunctivitis - Continue with the Singulair 10mg  daily.  - Continue with Xyzal (levocetirizine) 5mg  tablet once daily.   2. Rash/itching - ? eczema - Continue with clobetasol shampoo twice daily for two weeks. - Continue with Eucrisa twice daily on the face as needed. - Continue with triamcinolone ointment compounded with Eucerin twice daily for the rest of the body.   3. Severe persistent asthma, uncomplicated - Lung testing looked decent today. administered today. - Daily controller medication(s): Symbicort 160/4.5 two puffs twice daily with spacer + Flovent two puffs twice daily + Singulair 10mg  daily + Fasenra every 8 weeks - Rescue medications: ProAir 4 puffs every 4-6 hours as needed or albuterol nebulizer one vial puffs every 4-6 hours as needed - Asthma control goals:  * Full participation in all desired activities (may need albuterol before activity) * Albuterol use two time or less a week on average (not counting use with activity) * Cough interfering with sleep two time or less a month * Oral steroids no more than once a year * No hospitalizations  4. GERD - Continue with omeprazole daily.     5. Return in about 4 months (around 10/31/2020). This can be an in-person, a virtual Webex or a telephone follow up visit.   Please inform of any Emergency Department visits, hospitalizations, or changes in symptoms. Call before going to the ED for breathing or allergy symptoms since we might be able to fit you in for a sick visit. Feel free to contact 14/08/2020 anytime with any questions, problems, or concerns.  It was a pleasure to see you and your family again today!  Websites that have reliable patient information: 1. American Academy of Asthma, Allergy, and Immunology: www.aaaai.org 2. Food Allergy Research and Education (FARE): foodallergy.org 3. Mothers of Asthmatics: http://www.asthmacommunitynetwork.org 4. American College of Allergy,  Asthma, and Immunology: www.acaai.org   COVID-19 Vaccine Information can be found at: [place] For questions related to vaccine distribution or appointments, please email [email or web address] or call [phone].     "Like" [place] on Facebook and Instagram for our latest updates!        Make sure you are registered to vote! If you have moved or changed any of your contact information, you will need to get this updated before voting!  In some cases, you MAY be able to register to vote online: [email or web address]

## 2020-07-01 NOTE — Progress Notes (Signed)
FOLLOW UP  Date of Service/Encounter:  07/01/20   Assessment:   Rash - ? eczema  Severe persistent asthma- doing very well on Fasenra every 8 weeks (prednisone x 1 in nearly one year)  Recent COVID positive testing (Fall 2020) - now fully immunized  Complicated history, including a prolonged hospitalization following respiratory arrest in the field (Oct 2018)  Multiple complications secondary to recurrent prednisone courses, including bone abnormalities  History of non-compliance- improved  Plan/Recommendations:   1. Allergic rhinoconjunctivitis - Continue with the Singulair 10mg  daily.  - Continue with Xyzal (levocetirizine) 5mg  tablet once daily.   2. Rash/itching - ? eczema - Continue with clobetasol shampoo twice daily for two weeks. - Continue with Eucrisa twice daily on the face as needed. - Continue with triamcinolone ointment compounded with Eucerin twice daily for the rest of the body.   3. Severe persistent asthma, uncomplicated - Lung testing looked decent today. administered today. - Daily controller medication(s): Symbicort 160/4.5 two puffs twice daily with spacer + Flovent two puffs twice daily + Singulair 10mg  daily + Fasenra every 8 weeks - Rescue medications: ProAir 4 puffs every 4-6 hours as needed or albuterol nebulizer one vial puffs every 4-6 hours as needed - Asthma control goals:  * Full participation in all desired activities (may need albuterol before activity) * Albuterol use two time or less a week on average (not counting use with activity) * Cough interfering with sleep two time or less a month * Oral steroids no more than once a year * No hospitalizations  4. GERD - Continue with omeprazole daily.     5. Return in about 4 months (around 10/31/2020). This can be an in-person, a virtual Webex or a telephone follow up visit.   Subjective:   ELLIANA [name] is a 19 y.o. female presenting today for follow up of    Chief Complaint  Patient presents with  . Asthma    [name] has a history of the following: Patient Active Problem List   Diagnosis Date Noted  . Muscle spasm of back 07/14/2019  . Pyelonephritis 07/12/2019  . COVID-19 07/12/2019  . Peripheral neuropathy 08/24/2018  . Poor sleep hygiene 07/11/2018  . 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 Left hemiparesis (HCC) 01/17/2018  . 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 Anoxic brain injury (HCC) 09/28/2017  . 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 Spasticity 09/28/2017  . Encephalopathy 09/19/2017  . PCOS (polycystic ovarian syndrome) 09/29/2016  . Low bone density 09/29/2016  . 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 Severe asthma with acute exacerbation 07/26/2015  . Seasonal and perennial allergic rhinitis 07/26/2015  . 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  [name] is a 19 y.o. female presenting for a follow up visit. She was last seen in January 2021. At that time, she was not feeling well and we did not do a spirometry. We continued her on her regimen of Symbicort two puffs BID in combination with Flovent two puffs BID. We continued with her Singulair as well as cetirizine for her allergic rhinitis. GERD was controlled with omeprazole.   In the interim, she has missed an appointment and she is slightly behind on her Fasenra injections. But we finally did get her to come in for an appointment. Her mother called last week requesting an albuterol refill, which caused  [place] to become concerned that she was overusing her albuterol again, as she did when she was younger prior to her near-fatal asthma exacerbation.   Asthma/Respiratory Symptom History: Overall she is doing fairly well. She is on her Symbicort as well as the Flovent two puffs of each twice daily.  She has not needed  prednisone since we saw her in January. [name]'s asthma has been well controlled. She has not required rescue medication, experienced nocturnal awakenings due to lower respiratory symptoms, nor have activities of daily living been limited. She has required no Emergency Department or Urgent Care visits for her asthma. She has required zero courses of systemic steroids for asthma exacerbations since the last visit. ACT score today is 10, indicating terrible asthma symptom control. But this is reflective of a recent SOB episode which the family managed with albuterol alone. She has not needed any prednisone or antibiotics. She is feeling much better now and is very talkative today.   Allergic Rhinitis Symptom History: Allergic rhinitis is actually fairly well controlled. She is on the cetirizine and the montelukast alone. She has not needed antibiotics at all.  She has not made it to the dermatologist yet. They had to reschedule at one point. She does have an appointment scheduled this month.   Her mother has benign cancer which is causing her to have migraines and vomiting. This is happening on August 16th.    Otherwise, there have been no changes to her past medical history, surgical history, family history, or social history.    Review of Systems  Constitutional: Negative.  Negative for chills, fever, malaise/fatigue and weight loss.  HENT: Positive for congestion. Negative for ear discharge, ear pain and sinus pain.   Eyes: Negative for pain, discharge and redness.  Respiratory: Negative for cough, sputum production, shortness of breath and wheezing.   Cardiovascular: Negative.  Negative for chest pain and palpitations.  Gastrointestinal: Negative for abdominal pain, constipation, diarrhea, heartburn, nausea and vomiting.  Skin: Negative.  Negative for itching and rash.  Neurological: Negative for dizziness and headaches.  Endo/Heme/Allergies: Positive for environmental allergies. Does not  bruise/bleed easily.       Objective:   Blood pressure 110/80, pulse 80, resp. rate 18, height 5\' 5"  (1.651 m), SpO2 98 %. Body mass index is 37.91 kg/m.   Physical Exam:  Physical Exam Constitutional:      Appearance: She is well-developed.     Comments: Using a walker. Talkative.   HENT:     Head: Normocephalic and atraumatic.     Right Ear: Tympanic membrane, ear canal and external ear normal.     Left Ear: Tympanic membrane, ear canal and external ear normal.     Nose: No nasal deformity, septal deviation, mucosal edema or rhinorrhea.     Right Turbinates: Enlarged and swollen.     Left Turbinates: Enlarged and swollen.     Right Sinus: No maxillary sinus tenderness or frontal sinus tenderness.     Left Sinus: No maxillary sinus tenderness or frontal sinus tenderness.     Mouth/Throat:     Mouth: Mucous membranes are not pale and not dry.     Pharynx: Uvula midline.     Comments: Cobblestoning present in the posterior oropharynx.  Eyes:     General:        Right eye: No discharge.        Left eye: No discharge.     Conjunctiva/sclera: Conjunctivae normal.     Right eye: Right  conjunctiva is not injected. No chemosis.    Left eye: Left conjunctiva is not injected. No chemosis.    Pupils: Pupils are equal, round, and reactive to light.  Cardiovascular:     Rate and Rhythm: Normal rate and regular rhythm.     Heart sounds: Normal heart sounds.  Pulmonary:     Effort: Pulmonary effort is normal. No tachypnea, accessory muscle usage or respiratory distress.     Breath sounds: Normal breath sounds. No wheezing, rhonchi or rales.     Comments: Isolated wheeze in the left upper posterior chest.  Chest:     Chest wall: No tenderness.  Lymphadenopathy:     Cervical: No cervical adenopathy.  Skin:    Coloration: Skin is not pale.     Findings: No abrasion, erythema, petechiae or rash. Rash is not papular, urticarial or vesicular.     Comments: No eczematous or urticarial  lesions noted.   Neurological:     Mental Status: She is alert.  Psychiatric:        Behavior: Behavior is cooperative.      Diagnostic studies:    Spirometry: results abnormal (FEV1: 2.27/66%, FVC: 3.66/94%, FEV1/FVC: 62%).    Spirometry consistent with mild obstructive disease.   Allergy Studies: none     Malachi Bonds, MD  [facility]

## 2020-07-03 ENCOUNTER — Encounter: Payer: Self-pay | Admitting: Allergy & Immunology

## 2020-07-24 ENCOUNTER — Encounter: Payer: Self-pay | Admitting: Speech Pathology

## 2020-07-24 NOTE — Therapy (Signed)
SPEECH THERAPY DISCHARGE SUMMARY  Visits from Start of Care: 7  Current functional level related to goals / functional outcomes: See goals below. Not met as pt did not return after 02/12/20 visit (cancellations, no-shows).   Remaining deficits: Dysarthria, cognitive-communication deficits   Education / Equipment: HEP, comps for dysarthria  Plan: Patient agrees to discharge.  Patient goals were not met. Patient is being discharged due to not returning since the last visit.  ?????           SLP Short Term Goals - 07/24/20 1501      SLP SHORT TERM GOAL #1   Title Patient will demonstrate abdominal breathing at rest 85% accuracy over 5 minutes with min cues.    Baseline clavicular breathing    Time 1    Period Weeks    Status Not Met      SLP SHORT TERM GOAL #2   Title Patient will coordination respiration and phonation at phrase level 85% accuracy x 3 sessions.    Baseline 02/05/20    Time 1    Period Weeks    Status Not Met      SLP SHORT TERM GOAL #3   Title Patient will use slowed rate to compensate for dysarthria and allow additional motor processing time over 3 minute simple conversation with occasinal mod cues.    Baseline not currently using strategies    Time 2    Period Weeks    Status Not Met      SLP SHORT TERM GOAL #4   Title Patient will report completing speech therapy HEP at least 50% of the time prescribed between sessions    Time 1    Period Weeks    Status Not Met           SLP Long Term Goals - 07/24/20 1501      SLP LONG TERM GOAL #1   Title Patient will generate sentences with abdominal breathing 85% accuracy x3 sessions.    Baseline 0%    Time 5    Period Weeks    Status Not Met      SLP LONG TERM GOAL #2   Title Patient will increase breath groups to average 3+ words in 5 minute simple conversation x3 visits.    Baseline average 1 word    Time 5    Period Weeks    Status Not Met      SLP LONG TERM GOAL #3   Title Patient will  increase intelligibility from 70% to 85% in 5 minutes simple conversation with use of breathing techniques and compensations for dysarthria.    Baseline 75%    Time 5    Period Weeks    Status Not Met           Deneise Lever, MS, [facility] [address] [place], [name], [address] Phone: [phone]   Fax:  [phone]  Patient Details  Name: [name] MRN: [id number] Date of Birth: 08/08/2001 Referring Provider:  No ref. provider found  Encounter Date: 07/24/2020   Aliene Altes 07/24/2020, 3:01 PM  [facility] [address] [place], [name], [address] Phone: [phone]   Fax:  [phone]

## 2020-07-25 ENCOUNTER — Ambulatory Visit (INDEPENDENT_AMBULATORY_CARE_PROVIDER_SITE_OTHER): Payer: Medicaid Other | Admitting: Pediatrics

## 2020-07-30 ENCOUNTER — Other Ambulatory Visit (INDEPENDENT_AMBULATORY_CARE_PROVIDER_SITE_OTHER): Payer: Self-pay | Admitting: Pediatrics

## 2020-07-30 ENCOUNTER — Other Ambulatory Visit: Payer: Self-pay | Admitting: Allergy & Immunology

## 2020-07-30 DIAGNOSIS — G252 Other specified forms of tremor: Secondary | ICD-10-CM

## 2020-07-30 DIAGNOSIS — G931 Anoxic brain damage, not elsewhere classified: Secondary | ICD-10-CM

## 2020-07-30 DIAGNOSIS — R252 Cramp and spasm: Secondary | ICD-10-CM

## 2020-08-09 ENCOUNTER — Other Ambulatory Visit (INDEPENDENT_AMBULATORY_CARE_PROVIDER_SITE_OTHER): Payer: Self-pay | Admitting: Pediatrics

## 2020-08-11 ENCOUNTER — Other Ambulatory Visit (INDEPENDENT_AMBULATORY_CARE_PROVIDER_SITE_OTHER): Payer: Self-pay | Admitting: Pediatrics

## 2020-08-11 DIAGNOSIS — R252 Cramp and spasm: Secondary | ICD-10-CM

## 2020-08-11 DIAGNOSIS — G252 Other specified forms of tremor: Secondary | ICD-10-CM

## 2020-08-11 DIAGNOSIS — Z72821 Inadequate sleep hygiene: Secondary | ICD-10-CM

## 2020-08-11 DIAGNOSIS — G931 Anoxic brain damage, not elsewhere classified: Secondary | ICD-10-CM

## 2020-08-11 DIAGNOSIS — M62830 Muscle spasm of back: Secondary | ICD-10-CM

## 2020-08-22 ENCOUNTER — Telehealth: Payer: Self-pay | Admitting: Allergy & Immunology

## 2020-08-22 NOTE — Telephone Encounter (Signed)
PT called stating the [facility] surgery center never got the derm referral and it needs to be faxed to [phone] asap. She cannot schedule an appt until the referral is received by them.

## 2020-08-25 NOTE — Telephone Encounter (Signed)
Referral placed to the [facility] for the 3rd time. Patients mom informed.

## 2020-08-26 ENCOUNTER — Encounter (INDEPENDENT_AMBULATORY_CARE_PROVIDER_SITE_OTHER): Payer: Self-pay | Admitting: Pediatrics

## 2020-08-26 ENCOUNTER — Ambulatory Visit (INDEPENDENT_AMBULATORY_CARE_PROVIDER_SITE_OTHER): Payer: Medicaid Other | Admitting: Pediatrics

## 2020-08-26 ENCOUNTER — Ambulatory Visit: Payer: Self-pay

## 2020-08-26 ENCOUNTER — Ambulatory Visit (INDEPENDENT_AMBULATORY_CARE_PROVIDER_SITE_OTHER): Payer: Medicaid Other

## 2020-08-26 ENCOUNTER — Other Ambulatory Visit: Payer: Self-pay

## 2020-08-26 VITALS — BP 120/84 | HR 88 | Ht 64.5 in | Wt 234.0 lb

## 2020-08-26 DIAGNOSIS — J4550 Severe persistent asthma, uncomplicated: Secondary | ICD-10-CM | POA: Diagnosis not present

## 2020-08-26 DIAGNOSIS — G8194 Hemiplegia, unspecified affecting left nondominant side: Secondary | ICD-10-CM

## 2020-08-26 DIAGNOSIS — Z68.41 Body mass index (BMI) pediatric, greater than or equal to 95th percentile for age: Secondary | ICD-10-CM

## 2020-08-26 DIAGNOSIS — Z72821 Inadequate sleep hygiene: Secondary | ICD-10-CM

## 2020-08-26 DIAGNOSIS — R269 Unspecified abnormalities of gait and mobility: Secondary | ICD-10-CM | POA: Diagnosis not present

## 2020-08-26 DIAGNOSIS — G253 Myoclonus: Secondary | ICD-10-CM | POA: Diagnosis not present

## 2020-08-26 DIAGNOSIS — G252 Other specified forms of tremor: Secondary | ICD-10-CM

## 2020-08-26 DIAGNOSIS — R252 Cramp and spasm: Secondary | ICD-10-CM

## 2020-08-26 DIAGNOSIS — M62830 Muscle spasm of back: Secondary | ICD-10-CM

## 2020-08-26 DIAGNOSIS — R112 Nausea with vomiting, unspecified: Secondary | ICD-10-CM

## 2020-08-26 DIAGNOSIS — E669 Obesity, unspecified: Secondary | ICD-10-CM

## 2020-08-26 DIAGNOSIS — G931 Anoxic brain damage, not elsewhere classified: Secondary | ICD-10-CM

## 2020-08-26 MED ORDER — OMEPRAZOLE 20 MG PO CPDR: 20.0000 mg | DELAYED_RELEASE_CAPSULE | Freq: Every day | ORAL | 0 refills | Status: DC

## 2020-08-26 MED ORDER — BACLOFEN 20 MG PO TABS: 20.0000 mg | ORAL_TABLET | Freq: Three times a day (TID) | ORAL | 4 refills | Status: DC

## 2020-08-26 MED ORDER — LEVETIRACETAM 500 MG PO TABS: 500.0000 mg | ORAL_TABLET | Freq: Two times a day (BID) | ORAL | 5 refills | Status: DC

## 2020-08-26 MED ORDER — AMANTADINE HCL 100 MG PO TABS: ORAL_TABLET | ORAL | 5 refills | Status: DC

## 2020-08-26 MED ORDER — TRAZODONE HCL 50 MG PO TABS: ORAL_TABLET | ORAL | 4 refills | Status: DC

## 2020-08-26 MED ORDER — GABAPENTIN 300 MG PO CAPS: 300.0000 mg | ORAL_CAPSULE | Freq: Three times a day (TID) | ORAL | 4 refills | Status: DC

## 2020-08-26 MED ORDER — CLONAZEPAM 0.5 MG PO TABS: 1.0000 mg | ORAL_TABLET | Freq: Two times a day (BID) | ORAL | 5 refills | Status: DC

## 2020-08-26 NOTE — Telephone Encounter (Signed)
Great.  Thanks for taking care of that, [place]!   Malachi Bonds, MD [facility]

## 2020-08-26 NOTE — Progress Notes (Signed)
Patient: [name] MRN: [id number] Sex: female DOB: Jan 28, 2001  Provider: Ellison Carwin, MD Location of Care: [facility]  Note type: Routine return visit  History of Present Illness: Referral Source: Alena Bills, MD History from: both parents, patient and [facility] chart Chief Complaint: Post-hypoxic encephalopathy  FERMINA [name] is a 19 y.o. female who was evaluated August 26, 2020 for the first time since 02/15/2020.  She suffered an out of hospital pulmonary arrest from status asthmaticus September 15, 2017.  She was in coma with prolonged intubation, rhabdomyolysis with acute renal failure.  She had neurologic storms associated with tachycardia, fever, and hypertension.  She was on chronic corticosteroids and developed hyperglycemia and obesity.  After prolonged hospitalization she was severely weak, unable to walk and had severe cognitive impairment.  She has made slow progress since that time and is able to speak with mild dysarthria and a halting speech but can convey thoughts and feelings and understand most of what is told her.  She has history of nonspecific pain.  She has also musculoskeletal pain involving her low back.  She has difficulty walking more than a few steps.  She is stabilized with a cane.  When she is out in public she uses a portable wheelchair.  She can ambulate about 50 to 100 feet before she becomes tired or loses her balance.  She has a rash on her head, around her eyes, and her neck and upper back that looks like eczema.  This has not responded to topical corticosteroids and according to the patient affects her vision.  I do not see scleral irritation.  She has had rather significant nasal drainage.  This caused nausea and vomiting in the morning.  She also has gastroesophageal reflux.  She has been unwilling to return to her therapies.  Because of that she has been discharged from them.  As an adult, until she makes a decision that she  is willing to put in the effort to go to therapy and to work at home in between therapies, I am not going to reorder it.  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  Amantadine is used to help stimulate her.  She receives baclofen for spasticity.  She takes clonazepam a couple of times a day for anxiety, clonidine twice daily, trazodone at nighttime to help her sleep.  She still has problems with sleep but that is largely because she sleeps a lot during the day.  Everyone in the family developed Covid.  She ended up in the ICU although she did not have severe respiratory involvement.  She has subsequently been vaccinated.  Because of the rash around her eyes, she has some difficulty with her vision.  I do not think there is really been a change in her retina or her cornea but I cannot be certain.  She needs to have definitive treatment of her rash and then to see an eye doctor.  Review of Systems: A complete review of systems was remarkable for patient is here to be seen for post hypoxic encephalopathy. Mom reports that today is a shaky day for the patient. She states that the patient has been improving with her walking. She states that the patient is able to walk short distances around the house with a cane. She reports that the patient has been without her meds for amonth and they need refills. She has no  other concerns at this time., all other systems reviewed and negative.  Past Medical History Diagnosis Date  . Allergy   . Allergy    Multiple allergies - see allergy list  . Alopecia   . Asthma   . Asthma   . Family history of adverse reaction to anesthesia    Mom - Difficult to anesthesize/Hypotension  . Headache    With wheezing per Mom  . 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 Pyelonephritis 07/12/2019  . Vision abnormalities    Diminished vision Left Eye per Mom   Hospitalizations: No., Head Injury: No.,  Nervous System Infections: No., Immunizations up to date: Yes.    Copied from prior chart See history of present illness from January 17, 2018.  EEG September 17, 2017 showed 7 Hz 35 V posterior dominant rhythm, normal anterior-posterior gradient, well organized background which is continuous and symmetric. There were no interictal or ictal waveforms.  MRI of the brain without contrast September 19, 2017 is not available for review but shows bilateral posterior frontal left greater than right areas of restricted diffusion corresponding with T2 and flair hyperintensity. There were no signal abnormalities in the deep gray matter or brainstem.  EEG September 22, 2017 showed a 5-6 Hz 40 V, and posterior rhythm with slight anterior posterior gradient and well-organized background continuous and symmetric with no interictal or ictal activity.  CPK (10/26) 2, 282, (10/27) 17,102, (10/27) 64,403, (10/27) 44,002, (10/28) 29,970, (10/28) 24,137, (10/29) 17,109, (10/29) 10,312, (10/30) 6893, (10/31) 1, 806, (11/1) 581, (11/3) 3, 619 (11/5) 2, 112  Creatinine peaked at 1.41 (10/25) and normalized by 10/28 at 0.85  Glucoses were elevated related to stress and also corticosteroids, hemoglobin A1c 5.3  Lactic acid (10/25) 8.63, (10/25) 2.6, (10/26) 8.2, (10/26) 5.5, (10/26) 2.9, (10/26) 1.4  D-dimer (10/25) 10.51, (10/27) 1.41  Free T4 1.48, TSH 2.271 (11/8)  Birth History 9 Lbs. 0 oz. infant born at [redacted] weeks gestational age to a 19 year old g 2p 001 54female. Gestation wascomplicated byHyperemesis gravidarum Mother receivedPitocin and Epidural anesthesia Primarycesarean section Nursery Course wasuncomplicated Growth and Development wasrecalled asnormal  Behavior History none  Surgical History Procedure Laterality Date  . ADENOIDECTOMY    . ESOPHAGOGASTRODUODENOSCOPY (EGD) WITH PROPOFOL N/A 09/27/2017   Procedure: ESOPHAGOGASTRODUODENOSCOPY (EGD) WITH PROPOFOL;  Surgeon:  Adelene Amas, MD;  Location: [facility] ENDOSCOPY;  Service: Gastroenterology;  Laterality: N/A;  . PEG PLACEMENT N/A 09/27/2017   Procedure: PERCUTANEOUS ENDOSCOPIC GASTROSTOMY (PEG) PLACEMENT;  Surgeon: Adelene Amas, MD;  Location: [facility] ENDOSCOPY;  Service: Gastroenterology;  Laterality: N/A;  . TONSILLECTOMY    . WISDOM TOOTH EXTRACTION     Family History family history includes Asthma in her mother and paternal grandmother; Cancer in her maternal grandfather, mother, and paternal grandmother; Depression in her brother; Diabetes in her maternal grandfather and maternal grandmother; Hypertension in her maternal grandfather and maternal grandmother; Vision loss in her mother. Family history is negative for migraines, seizures, intellectual disabilities, blindness, deafness, birth defects, chromosomal disorder, or autism.  Social History Socioeconomic History  . Marital status: Single  . Years of education:  34  . Highest education level:  High school junior  Occupational History  . Not employed  Tobacco Use  . Smoking status: Never Smoker  . Smokeless tobacco: Never Used  . Tobacco comment: no smokers in the home  Vaping Use  . Vaping Use: Never used  Substance and Sexual Activity  . Alcohol use: No  . Drug use: No  .  Sexual activity: Never    Comment: Metformin for irregular menstrual cycles  Social History Narrative    Khadejah is currently not in school    Patient lives at home with mom, dad, and brother.    Allergies Allergen Reactions  . Azithromycin Anaphylaxis  . Biaxin [Clarithromycin] Anaphylaxis  . Erythromycin Anaphylaxis  . Macrolides And Ketolides Anaphylaxis  . Nitrofurantoin Monohyd Macro Anaphylaxis  . Quinolones Anaphylaxis  . Troleandomycin Anaphylaxis  . Mango Flavor Hives   Physical Exam BP 120/84   Pulse 88   Ht 5' 4.5" (1.638 m)   Wt 234 lb (106.1 kg)   BMI 39.55 kg/m   General: alert, well developed, well nourished, in no acute distress, brown hair,  brown eyes, right handed Head: normocephalic, no dysmorphic features Ears, Nose and Throat: Otoscopic: tympanic membranes normal; pharynx: oropharynx is pink without exudates or tonsillar hypertrophy Neck: supple, full range of motion, no cranial or cervical bruits Respiratory: auscultation clear Cardiovascular: no murmurs, pulses are normal Musculoskeletal: no skeletal deformities or apparent scoliosis Skin: no rashes or neurocutaneous lesions  Neurologic Exam  Mental Status: alert; oriented to person; knowledge is below normal for age; language is below normal; this has improved she is able to speak with dysarthria but is intelligible Cranial Nerves: visual fields are full to double simultaneous stimuli; extraocular movements are full and conjugate; pupils are round reactive to light; funduscopic examination shows sharp disc margins with normal vessels; symmetric, impassive facial strength; midline tongue and uvula; air conduction is greater than bone conduction bilaterally Motor: mild left-sided weakness, increased tone, especially in her legs and mass; good fine motor movements on the right, left hand is semifisted with good strength; no pronator drift Sensory: intact responses to cold, vibration, proprioception; stereognosis is better on the right Coordination: good finger-to-nose; rapid repetitive alternating movements and finger apposition are clumsy Gait and Station: Broad-based, shuffling gait and station: patient is able to take a few steps, pivot and turn with several steps and returned her wheelchair and sit down independently; balance is fair Reflexes: symmetric and diminished bilaterally; no clonus; bilateral flexor plantar responses  Assessment 1.  Anoxic brain injury, G93.1. 2.  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  Action induced myoclonus, G25.3. 4.  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 5.  Muscle spasm of back, M62.830. 6.  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  E66.9, Z68.54 7.   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 8.  Poor sleep hygiene, Z72.821.  Discussion Gwen appears medically neurologically stable.  Why would like her to return to therapy, I will not force that on her because it will not be useful.  I do not see any reason to change her medications.  In addition to the medications I ordinarily prescribed I also gave her omeprazole because she had run out of it and for that reason I think was having nausea and vomiting.  I asked her family to make certain that her primary doctor refilled his subsequent refills.  Plan Prescriptions were refilled for levetiracetam, clonazepam, amantadine, gabapentin, trazodone, baclofen, and omeprazole.  She will return to see me in 6 months' time I will see her sooner based on clinical need.  I agree with her mother that she needs to go see a dermatologist to get the rash under control.  I think that she would benefit from getting back into the water and swimming.  This is something that she enjoys.  She will burn some calories and it might help to maintain her weight.  Greater than 50% of a  30-minute visit was spent in counseling and coordination of care regarding her seizures, spasticity, movement disorders, gait disorder, and sleep hygiene.   Medication List   Accurate as of August 26, 2020 11:59 PM. If you have any questions, ask your nurse or doctor.    albuterol 108 (90 Base) MCG/ACT inhaler Commonly known as: ProAir HFA Inhale 2 puffs into the lungs every 4 (four) hours as needed for wheezing or shortness of breath.   Amantadine HCl 100 MG tablet Take 1-1/2 tablets in the morning and 1 tablet at lunchtime   baclofen 20 MG tablet Commonly known as: LIORESAL Take 1 tablet (20 mg total) by mouth 3 (three) times daily.   budesonide-formoterol 160-4.5 MCG/ACT inhaler Commonly known as: Symbicort Inhale 2 puffs into the lungs 2 (two) times daily.   cetirizine 10 MG tablet Commonly known as: ZYRTEC Take 10 mg by mouth daily.    Clobetasol Propionate 0.05 % shampoo Apply 1 application topically 2 (two) times daily. What changed:   when to take this  reasons to take this   clobetasol ointment 0.05 % Commonly known as: TEMOVATE Apply 1 application topically 2 (two) times daily. What changed:   when to take this  reasons to take this   Clobetasol Propionate 0.05 % shampoo Shampoo twice daily for two weeks What changed: Another medication with the same name was changed. Make sure you understand how and when to take each.   Clobex 0.05 % shampoo Generic drug: Clobetasol Propionate Apply 1 application topically 2 (two) times daily. What changed: Another medication with the same name was changed. Make sure you understand how and when to take each.   clonazePAM 0.5 MG tablet Commonly known as: KLONOPIN Take 2 tablets (1 mg total) by mouth 2 (two) times daily.   cloNIDine 0.1 MG tablet Commonly known as: CATAPRES TAKE ONE TABLET BY MOUTH TWICE A DAY *MAKE APPT FOR FURTHER REFILLS*   EPINEPHrine 0.3 mg/0.3 mL Soaj injection Commonly known as: EPI-PEN Inject 0.3 mLs (0.3 mg total) into the muscle once as needed for anaphylaxis.   Eucrisa 2 % Oint Generic drug: Crisaborole APPLY ONE APPLICATION TOPICALLY TWICE A DAY (TO THE FACE)   Fasenra 30 MG/ML S[place] Generic drug: Benralizumab INJECT 30 MG INTO THE SKIN EVERY 8 WEEKS.   Flovent HFA 110 MCG/ACT inhaler Generic drug: fluticasone INHALE TWO PUFFS BY MOUTH INTO THE LUNGS TWICE A DAY   gabapentin 300 MG capsule Commonly known as: NEURONTIN Take 1 capsule (300 mg total) by mouth 3 (three) times daily.   ipratropium 0.02 % nebulizer solution Commonly known as: ATROVENT Inhale 0.5 mg into the lungs every 6 (six) hours as needed for wheezing or shortness of breath.   ketoconazole 2 % shampoo Commonly known as: NIZORAL Shampoo every 3-4 days for 8 weeks, then use prn What changed:   how much to take  how to take this  when to take  this  additional instructions   levETIRAcetam 500 MG tablet Commonly known as: KEPPRA Take 1 tablet (500 mg total) by mouth 2 (two) times daily.   levocetirizine 5 MG tablet Commonly known as: XYZAL Take 1 tablet (5 mg total) by mouth every evening.   metFORMIN 500 MG tablet Commonly known as: GLUCOPHAGE TAKE 1 TABLET (500 MG TOTAL) BY MOUTH DAILY.   methocarbamol 500 MG tablet Commonly known as: Robaxin Take 1 tablet (500 mg total) by mouth once as needed for up to 2 doses for muscle spasms.   montelukast 10  MG tablet Commonly known as: SINGULAIR Take 1 tablet (10 mg total) by mouth at bedtime.   omeprazole 20 MG capsule Commonly known as: PRILOSEC Take 1 capsule (20 mg total) by mouth daily.   traZODone 50 MG tablet Commonly known as: DESYREL TAKE TWO TABLETS BY MOUTH BEFORE BEDTIME   triamcinolone ointment 0.1 % Commonly known as: KENALOG APPLY ONE APPLICATION TOPICALLY TWICE A DAY (MIX WITH EUCERIN)    The medication list was reviewed and reconciled. All changes or newly prescribed medications were explained.  A complete medication list was provided to the patient/caregiver.  Deetta Perla MD

## 2020-08-26 NOTE — Patient Instructions (Signed)
Thank you for coming.  I am going to work on trying to find an appropriate position to provide care for you after I retire August 21, 2021.  I refilled your prescriptions for the medicines I prescribed.  I refilled the omeprazole for 1 refill.  You will need to get her primary doctor to refill further.  Overall, I think she is doing well.  I hope that she can get her rash under control, and that she will feel better.  Getting her to walk every day.  Getting her in a pool several times a week would be a very good thing.  You have to get rid of the rash first.  Please come back and see me in 6 months' time.  Let me know if I need to see her before then.

## 2020-08-28 NOTE — Telephone Encounter (Signed)
Patient is scheduled for 11/05/20 @ 10 with Dr Melida Quitter at the [facility].

## 2020-09-05 ENCOUNTER — Telehealth: Payer: Self-pay | Admitting: Allergy & Immunology

## 2020-09-05 MED ORDER — PREDNISONE 10 MG PO TABS: ORAL_TABLET | ORAL | 0 refills | Status: DC

## 2020-09-05 NOTE — Telephone Encounter (Signed)
This is the patient I spoke with you about 

## 2020-09-05 NOTE — Telephone Encounter (Signed)
Prednisone sent in. Patient's mother notified.   Malachi Bonds, MD [facility]

## 2020-09-05 NOTE — Telephone Encounter (Signed)
Patient mom called and said that she was having a break out on her face and around her eyes. She would like to have pedisone called in to help her. [place] [facility]// [phone].

## 2020-10-21 ENCOUNTER — Ambulatory Visit: Payer: Self-pay

## 2020-11-27 ENCOUNTER — Telehealth (INDEPENDENT_AMBULATORY_CARE_PROVIDER_SITE_OTHER): Payer: Self-pay | Admitting: Pediatrics

## 2020-11-27 NOTE — Telephone Encounter (Signed)
  Who's calling (name and relationship to patient) : [name] ( mom)  Best contact number: [phone]  Provider they see: Dr. Sharene Skeans  Reason for call: Patient received a jury summons and mom does not feel with her brain injury that she would be able to serve. She needs a letter stating that the patient does have a brain injury and why she would not be able to be on a jury. Mom will pick up letter whenever it is ready.      PRESCRIPTION REFILL ONLY  Name of prescription:  Pharmacy:

## 2020-11-27 NOTE — Telephone Encounter (Signed)
L/M informing mom that we did receive her voicemail. Also informed her that Dr. Sharene Skeans is out of the office until next Wednesday. Invited her to call back if this is needed ASAP and we could get our NP to write one.

## 2020-11-28 NOTE — Telephone Encounter (Signed)
L/M informing mom that the letter she requested is ready for pick up 

## 2020-12-29 ENCOUNTER — Telehealth: Payer: Self-pay | Admitting: Allergy & Immunology

## 2020-12-29 NOTE — Telephone Encounter (Signed)
Please advise 

## 2020-12-29 NOTE — Telephone Encounter (Signed)
Patient was seen by a dermatology. It was suggested that she receive an injection, but needed to know if it would interfere with her Harrington Challenger. Mom said her face is itching. She said notes were sent to Dr. Dellis Anes from the dermatologist and no one has called them back to follow up from our office.

## 2020-12-30 NOTE — Telephone Encounter (Signed)
I cannot find any of those notes. Are they in my folder in [place]?   Malachi Bonds, MD [facility]

## 2020-12-31 NOTE — Telephone Encounter (Signed)
I looked in the [place] office and there are not any records from dermatology on [name].

## 2020-12-31 NOTE — Telephone Encounter (Signed)
Beth do you mind checking to see if there are any dermatology notes in Dr. Ellouise Newer in basket in the [place] office please? Thank You so much.

## 2020-12-31 NOTE — Telephone Encounter (Signed)
Called and spoke with patient's mom and advised that we had not received the notes from their office. She is going to call their office in the morning and have them re-fax the notes and recommendations to our office.

## 2021-01-13 NOTE — Telephone Encounter (Signed)
Dad of patient misplaced the letter to excuse patient from jury duty. Mom would like to come pick it up again. Irving Burton will print letter in [address] office mom will pick up today

## 2021-02-11 ENCOUNTER — Telehealth: Payer: Self-pay | Admitting: *Deleted

## 2021-02-11 NOTE — Telephone Encounter (Signed)
Mom called to see if Dr. Dellis Anes received records from Dr. Melida Quitter.   We have not received records. Called [facility] and transferred to Dr. Evans Lance office.  Had to leave a message to call me back regarding records for Dr. Dellis Anes to review. Mom informed and I will contact Dr. Evans Lance office again tomorrow.  Will also get [place] scheduled for her follow up with Dr. Dellis Anes.

## 2021-02-12 NOTE — Telephone Encounter (Signed)
Called Dr. Evans Lance office again and spoke to [place].  She will fax over the office note from Dr. Melida Quitter for Dr. Dellis Anes to review. Mom notified of update and will be contacted after Dr. Dellis Anes reviews note.  Mom voiced understanding.

## 2021-02-12 NOTE — Telephone Encounter (Signed)
Received office note from Dr. Evans Lance office.  Dr. Dellis Anes will review note regarding Dupixent.

## 2021-02-13 NOTE — Telephone Encounter (Signed)
I did review the notes and they will be scanned into the system.  Now with this diagnosis of eczema, we can certainly consider Dupixent instead of Fasenra.  However, I would still prefer for her to get the Dupixent in our office due to her history of noncompliance prior to her prolonged hospitalization in 2018 secondary to respiratory failure.  Malachi Bonds, MD [facility]

## 2021-02-16 NOTE — Telephone Encounter (Signed)
Patient needs MD appt in our office

## 2021-02-16 NOTE — Telephone Encounter (Signed)
Spoke with mom and made appointment for patient tomorrow-02/17/21 at 4:45 pm with Dr. Dellis Anes to discuss Dupixent.

## 2021-02-17 ENCOUNTER — Ambulatory Visit (INDEPENDENT_AMBULATORY_CARE_PROVIDER_SITE_OTHER): Payer: Medicaid Other | Admitting: *Deleted

## 2021-02-17 ENCOUNTER — Other Ambulatory Visit (INDEPENDENT_AMBULATORY_CARE_PROVIDER_SITE_OTHER): Payer: Self-pay | Admitting: Pediatrics

## 2021-02-17 ENCOUNTER — Encounter: Payer: Self-pay | Admitting: Allergy & Immunology

## 2021-02-17 ENCOUNTER — Other Ambulatory Visit: Payer: Self-pay

## 2021-02-17 ENCOUNTER — Ambulatory Visit (INDEPENDENT_AMBULATORY_CARE_PROVIDER_SITE_OTHER): Payer: Medicaid Other | Admitting: Allergy & Immunology

## 2021-02-17 VITALS — BP 120/80 | HR 93 | Temp 98.0°F | Resp 16 | Ht 64.5 in | Wt 234.0 lb

## 2021-02-17 DIAGNOSIS — J4550 Severe persistent asthma, uncomplicated: Secondary | ICD-10-CM

## 2021-02-17 DIAGNOSIS — G931 Anoxic brain damage, not elsewhere classified: Secondary | ICD-10-CM

## 2021-02-17 DIAGNOSIS — R252 Cramp and spasm: Secondary | ICD-10-CM

## 2021-02-17 DIAGNOSIS — G252 Other specified forms of tremor: Secondary | ICD-10-CM

## 2021-02-17 NOTE — Patient Instructions (Addendum)
1. Allergic rhinoconjunctivitis - Continue with the Singulair 10mg  daily.  - Continue with Xyzal (levocetirizine) 5mg  tablet once daily.  - Add on Pataday one drop per eye twice daily.   2. Eczema - Hopefully the Dupient will help with the eczema.  - Continue with clobetasol shampoo twice every other week like you are doing. - Continue with Eucrisa twice daily on the face as needed. - Continue with triamcinolone ointment compounded with Eucerin twice daily for the rest of the body.   3. Severe persistent asthma, uncomplicated - Lung testing looked decent today. administered today.  - We will work on getting Dupixent approved (this will replace the [place]). - It is well tolerated aside from conjunctivitis (treated with eye drops). - New nebulizer machine provided today.  - Start prednisone dose pack (and one extra pack provided to use if needed).  - Daily controller medication(s): Symbicort 160/4.5 two puffs twice daily with spacer + Flovent Harrington Challenger two puffs twice daily + Singulair 10mg  daily + Fasenra every 8 weeks - Rescue medications: ProAir 4 puffs every 4-6 hours as needed or albuterol nebulizer one vial puffs every 4-6 hours as needed - Asthma control goals:  * Full participation in all desired activities (may need albuterol before activity) * Albuterol use two time or less a week on average (not counting use with activity) * Cough interfering with sleep two time or less a month * Oral steroids no more than once a year * No hospitalizations  4. GERD - Continue with omeprazole daily.     5. Return in about 3 months (around 05/20/2021).    Please inform of any Emergency Department visits, hospitalizations, or changes in symptoms. Call before going to the ED for breathing or allergy symptoms since we might be able to fit you in for a sick visit. Feel free to contact 05/22/2021 anytime with any questions, problems, or concerns.  It was a pleasure to see you and your family  again today!  Websites that have reliable patient information: 1. American Academy of Asthma, Allergy, and Immunology: www.aaaai.org 2. Food Allergy Research and Education (FARE): foodallergy.org 3. Mothers of Asthmatics: http://www.asthmacommunitynetwork.org 4. American College of Allergy, Asthma, and Immunology: www.acaai.org   COVID-19 Vaccine Information can be found at: [place] For questions related to vaccine distribution or appointments, please email [email or web address] or call [phone].   We realize that you might be concerned about having an allergic reaction to the COVID19 vaccines. To help with that concern, WE ARE OFFERING THE COVID19 VACCINES IN OUR OFFICE! Ask the front desk for dates!     "Like" [place] on Facebook and Instagram for our latest updates!      A healthy democracy works best when [email or web address] participate! Make sure you are registered to vote! If you have moved or changed any of your contact information, you will need to get this updated before voting!  In some cases, you MAY be able to register to vote online: [phone]

## 2021-02-17 NOTE — Progress Notes (Signed)
FOLLOW UP  Date of Service/Encounter:  02/17/21   Assessment:   Eczema  Severe persistent asthma- doing very well on Fasenra every 8 weeks (prednisone x 1 in > 1 year), but now with atopic dermatitis and changing to Dupixent  Seasonal and perennial allergies rhinitis   Recent COVID positive testing (Fall 2020) - now fully immunized  Complicated history, including a prolonged hospitalization following respiratory arrest in the field (Oct 2018)  Multiple complications secondary to recurrent prednisone courses, including bone abnormalities  History of non-compliance- improved  Plan/Recommendations:   1. Allergic rhinoconjunctivitis - Continue with the Singulair 10mg  daily.  - Continue with Xyzal (levocetirizine) 5mg  tablet once daily.  - Add on Pataday one drop per eye twice daily.   2. Eczema - Hopefully the Dupient will help with the eczema.  - Continue with clobetasol shampoo twice every other week like you are doing. - Continue with Eucrisa twice daily on the face as needed. - Continue with triamcinolone ointment compounded with Eucerin twice daily for the rest of the body.   3. Severe persistent asthma, uncomplicated - Lung testing looked decent today. administered today.  - We will work on getting Dupixent approved (this will replace the [place]). - It is well tolerated aside from conjunctivitis (treated with eye drops). - New nebulizer machine provided today.  - Start prednisone dose pack (and one extra pack provided to use if needed).  - Daily controller medication(s): Symbicort 160/4.5 two puffs twice daily with spacer + Flovent [place] two puffs twice daily + Singulair 10mg  daily + Fasenra every 8 weeks - Rescue medications: ProAir 4 puffs every 4-6 hours as needed or albuterol nebulizer one vial puffs every 4-6 hours as needed - Asthma control goals:  * Full participation in all desired activities (may need albuterol before activity) *  Albuterol use two time or less a week on average (not counting use with activity) * Cough interfering with sleep two time or less a month * Oral steroids no more than once a year * No hospitalizations  4. GERD - Continue with omeprazole daily.     5. Return in about 3 months (around 05/20/2021).    Subjective:   [name] is a 20 y.o. female presenting today for follow up of  Chief Complaint  Patient presents with  . Asthma    Wheezing with congestion   . Other    Here to discuss switching to dupixent from fasenera     [name] has a history of the following: Patient Active Problem List   Diagnosis Date Noted  . Muscle spasm of back 07/14/2019  . Pyelonephritis 07/12/2019  . COVID-19 07/12/2019  . Peripheral neuropathy 08/24/2018  . Poor sleep hygiene 07/11/2018  . 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 Left hemiparesis (HCC) 01/17/2018  . 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 Anoxic brain injury (HCC) 09/28/2017  . 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 Spasticity 09/28/2017  . Encephalopathy 09/19/2017  . PCOS (polycystic ovarian syndrome) 09/29/2016  . Low bone density 09/29/2016  . 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 Severe asthma with acute exacerbation 07/26/2015  . Seasonal and perennial allergic rhinitis 07/26/2015  . Obesity peds (BMI >=95 percentile) 02/29/2012    History obtained from: chart review and patient.  [name] is a 20 y.o. female presenting for a follow up visit.  She was last seen in August 2021.  At that time, her  allergies were controlled with Singulair and Xyzal.  Her asthma was under good control with Symbicort 2 puffs twice daily as well as Flovent 2 puffs twice daily, Singulair 10 mg daily, and Fasenra every 8 weeks.  We also continue the omeprazole daily.  She was endorsing a rash which I thought might be eczema.  We continued with clobetasol shampoo  twice daily for 2 weeks as well as Eucrisa twice daily as needed and triamcinolone mixed with Eucerin twice daily for the rest of her body.  Her rash is mostly concentrated on her neck.  Eventually, we sent her to see dermatology because it was not fully resolving with our treatment plan.  Since the last visit, she has mostly done well. There was some confusion on the notes with regards to faxing the notes from the dermatology office to our office.  Evidently, there was discussion about starting Dupixent.  The dermatologist said we should do it, but we never actually got these notes.  Mom was under the impression that we were working on [employer] approval, although she never got any phone calls from [place] about that.  Regardless, she has now missed 2 Fasenra injections and presents today to reestablish care.  Since last visit, she has done well.    Asthma/Respiratory Symptom History: She remains on thre Symbicort two puffs twice daily and Flovent two puffs twice daily with a spacer. She has not received Fasenra in several months. She was told to wait on the Fasenra until the Dupixent is approved. Of note, she has been receiving prednisone for a few days from a stash that Mom has on hand at home. She has been staying at her friend's house whose mother is a Engineer, civil (consulting). This is kind of a respite for Mom.  She has not needed prednisone at all.  He has not been to the emergency room.  Allergic Rhinitis Symptom History: She is on her Xyzal and her Singulair.  She never has been on to use no sprays.  This is a particularly bad time of the year for her.  She has never been on allergy shots.  She has not needed antibiotics or steroids.  Eczema Symptom History: She was treated with a topical steorid. Dupixent was brought up and her dermatology appointment.  Mom did not like the dermatologist and does not really want to go back. However, she is hopeful that the Dupxient can treat her asthma and her atopic dermatitis and would  like to try to get this approved.   She continues to work on her rehabilitation.  Her dad was recently sworn in as an Emergency planning/management officer.  This was then a ceremony in [place], which unfortunately they were unable to attend in person due to Covid.  Otherwise, there have been no changes to her past medical history, surgical history, family history, or social history.    Review of Systems  Constitutional: Negative.  Negative for chills, fever, malaise/fatigue and weight loss.  HENT: Negative.  Negative for congestion, ear discharge and ear pain.   Eyes: Negative for pain, discharge and redness.  Respiratory: Positive for cough, shortness of breath and wheezing. Negative for sputum production.   Cardiovascular: Negative.  Negative for chest pain and palpitations.  Gastrointestinal: Negative for abdominal pain, heartburn, nausea and vomiting.  Skin: Negative.  Negative for itching and rash.  Neurological: Negative for dizziness and headaches.  Endo/Heme/Allergies: Negative for environmental allergies. Does not bruise/bleed easily.       Objective:   Blood pressure  120/80, pulse 93, temperature 98 F (36.7 C), resp. rate 16, height 5' 4.5" (1.638 m), weight 234 lb (106.1 kg), SpO2 96 %. Body mass index is 39.55 kg/m.   Physical Exam:  Physical Exam Constitutional:      Appearance: She is well-developed.     Comments: Smiling. Speech does not seem as clear as it has been in the past.   HENT:     Head: Normocephalic and atraumatic.     Right Ear: Tympanic membrane, ear canal and external ear normal.     Left Ear: Tympanic membrane, ear canal and external ear normal.     Nose: No nasal deformity, septal deviation, mucosal edema or rhinorrhea.     Right Turbinates: Enlarged, swollen and pale.     Left Turbinates: Enlarged, swollen and pale.     Right Sinus: No maxillary sinus tenderness or frontal sinus tenderness.     Left Sinus: No maxillary sinus tenderness or frontal sinus  tenderness.     Comments: Clear mucous bilaterally. No nasal polyps.    Mouth/Throat:     Mouth: Mucous membranes are not pale and not dry.     Pharynx: Uvula midline.  Eyes:     General: Allergic shiner present.        Right eye: No discharge.        Left eye: No discharge.     Conjunctiva/sclera: Conjunctivae normal.     Right eye: Right conjunctiva is not injected. No chemosis.    Left eye: Left conjunctiva is not injected. No chemosis.    Pupils: Pupils are equal, round, and reactive to light.  Cardiovascular:     Rate and Rhythm: Normal rate and regular rhythm.     Heart sounds: Normal heart sounds.  Pulmonary:     Effort: Pulmonary effort is normal. No tachypnea, accessory muscle usage or respiratory distress.     Breath sounds: Normal breath sounds. No wheezing, rhonchi or rales.     Comments: Moving air well in all lung fields. No increased work of breathing.  Chest:     Chest wall: No tenderness.  Lymphadenopathy:     Cervical: No cervical adenopathy.  Skin:    General: Skin is warm.     Capillary Refill: Capillary refill takes less than 2 seconds.     Coloration: Skin is not pale.     Findings: No abrasion, erythema, petechiae or rash. Rash is not papular, urticarial or vesicular.     Comments: She does have some eczematous lesions over the neck.   Neurological:     Mental Status: She is alert.  Psychiatric:        Behavior: Behavior is cooperative.      Diagnostic studies:   Spirometry: results normal (FEV1: 2.51/75%, FVC: 3.50/92%, FEV1/FVC: 72%).    Spirometry consistent with normal pattern.   Allergy Studies: none        Malachi Bonds, MD  [facility]

## 2021-02-17 NOTE — Telephone Encounter (Signed)
Please send to the pharmacy °

## 2021-02-17 NOTE — Telephone Encounter (Signed)
Sounds good!  Thank you!  ° °Harden Bramer, MD °[facility] ° °

## 2021-02-18 ENCOUNTER — Other Ambulatory Visit: Payer: Self-pay | Admitting: *Deleted

## 2021-02-18 MED ORDER — TRIAMCINOLONE ACETONIDE 0.1 % EX OINT: TOPICAL_OINTMENT | Freq: Two times a day (BID) | CUTANEOUS | 5 refills | Status: DC

## 2021-02-18 MED ORDER — LEVOCETIRIZINE DIHYDROCHLORIDE 5 MG PO TABS: 5.0000 mg | ORAL_TABLET | Freq: Every evening | ORAL | 5 refills | Status: DC

## 2021-02-18 MED ORDER — BUDESONIDE-FORMOTEROL FUMARATE 160-4.5 MCG/ACT IN AERO: 2.0000 | INHALATION_SPRAY | Freq: Two times a day (BID) | RESPIRATORY_TRACT | 5 refills | Status: DC

## 2021-02-19 ENCOUNTER — Encounter: Payer: Self-pay | Admitting: Allergy & Immunology

## 2021-02-23 ENCOUNTER — Telehealth: Payer: Self-pay | Admitting: *Deleted

## 2021-02-23 NOTE — Telephone Encounter (Signed)
L/M for mother to contact me to advise approval for Dupixent and submit to [employer] and delivery to office

## 2021-02-23 NOTE — Telephone Encounter (Signed)
-----   Message from Alfonse Spruce, MD sent at 02/19/2021  9:17 AM EDT ----- Dupixent please!

## 2021-02-26 NOTE — Telephone Encounter (Signed)
Called and spoke to mother and advised approval and submit for Dupixent. Will advise when delivery set up to come in to start therapy

## 2021-02-27 ENCOUNTER — Encounter (INDEPENDENT_AMBULATORY_CARE_PROVIDER_SITE_OTHER): Payer: Self-pay | Admitting: Pediatrics

## 2021-02-27 ENCOUNTER — Ambulatory Visit (INDEPENDENT_AMBULATORY_CARE_PROVIDER_SITE_OTHER): Payer: Medicaid Other | Admitting: Pediatrics

## 2021-02-27 ENCOUNTER — Other Ambulatory Visit: Payer: Self-pay

## 2021-02-27 VITALS — BP 130/80 | HR 84 | Ht 64.5 in | Wt 236.4 lb

## 2021-02-27 DIAGNOSIS — R269 Unspecified abnormalities of gait and mobility: Secondary | ICD-10-CM

## 2021-02-27 DIAGNOSIS — G253 Myoclonus: Secondary | ICD-10-CM | POA: Diagnosis not present

## 2021-02-27 DIAGNOSIS — G931 Anoxic brain damage, not elsewhere classified: Secondary | ICD-10-CM

## 2021-02-27 DIAGNOSIS — G252 Other specified forms of tremor: Secondary | ICD-10-CM

## 2021-02-27 DIAGNOSIS — R252 Cramp and spasm: Secondary | ICD-10-CM

## 2021-02-27 DIAGNOSIS — M62830 Muscle spasm of back: Secondary | ICD-10-CM

## 2021-02-27 DIAGNOSIS — Z72821 Inadequate sleep hygiene: Secondary | ICD-10-CM

## 2021-02-27 DIAGNOSIS — G8194 Hemiplegia, unspecified affecting left nondominant side: Secondary | ICD-10-CM

## 2021-02-27 MED ORDER — GABAPENTIN 300 MG PO CAPS: 300.0000 mg | ORAL_CAPSULE | Freq: Three times a day (TID) | ORAL | 5 refills | Status: DC

## 2021-02-27 MED ORDER — BACLOFEN 20 MG PO TABS: 20.0000 mg | ORAL_TABLET | Freq: Three times a day (TID) | ORAL | 5 refills | Status: DC

## 2021-02-27 MED ORDER — TRAZODONE HCL 50 MG PO TABS: ORAL_TABLET | ORAL | 5 refills | Status: DC

## 2021-02-27 MED ORDER — LEVETIRACETAM 500 MG PO TABS: 500.0000 mg | ORAL_TABLET | Freq: Two times a day (BID) | ORAL | 5 refills | Status: DC

## 2021-02-27 MED ORDER — AMANTADINE HCL 100 MG PO TABS: ORAL_TABLET | ORAL | 5 refills | Status: DC

## 2021-02-27 NOTE — Progress Notes (Signed)
Patient: [name] MRN: [id number] Sex: female DOB: 2001/10/20  Provider: Ellison Carwin, MD Location of Care: [facility]  Note type: Routine return visit  History of Present Illness: Referral Source: Alena Bills, MD History from: mother, patient and [facility] chart Chief Complaint: Post-hypoxic encephalopathy  [name] is a 20 y.o. female who was evaluated February 27, 2021 for the first time since August 26, 2020.  She suffered an out of hospital pulmonary arrest from status asthmaticus on September 15, 2017.  Using, with prolonged intubation, rhabdomyolysis with acute renal failure, neurologic "storms" associated tachycardia fever and hypertension.  She was treated with chronic corticosteroids to suppress her asthma attacks and developed hyperglycemia and obesity.  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  She is made slow but steady progress over that time.  She can speak in sentences with mild dysarthria and halting speech but can convey thoughts and feelings and understand most of what is said to her.  She is walking more steadily and can walk 50 to 100 feet before she becomes tired or loses her balance.  She has intermittent spasms of tremulous behavior affecting her head limbs and trunk that seem to be cyclical and have happened twice this month.  These can last for minutes at a time, sometimes longer.  She has no control in her balance at that time is quite poor.  In public she uses a portable wheelchair.  In the office she has a rolling walker.  She would benefit from a scooter that would allow her greater independence in the community goes for shopping for community events and when the family goes to parks or other areas that require walking greater distances than she can manage.  This reflects the improvement in her fine and gross motor skills and her interest in participating actively as other young adults her age.  I think  that this is medically necessary to improve her independence and safety within the community.  I advised her mother to contact the physical therapist who used to work with her.  I will be happy to make a referral so that they can determine the exact specifications of a scooter as part of that face-to-face determination of medical necessity for this durable medical goods.  I believe that she has the strength, coordination, and balance to operate a scooter safely.  This would be principally used in the community under supervision.  I believe that it would be less awkward and easier to transport within the family vehicle than a motorized wheelchair.  Her asthma has markedly improved with the use of a monoclonal antibody notice Fasenra.  She is experiencing some rash on her head around her eyes and her upper neck and back when she takes the medication.  Though this looks like eczema to me, the dermatologist believes it is a reaction.  She is going to be switched over to Dupixent.  Hopefully that will work as well without the side effects.  She still needs to take occasional doses of prednisone when she has exacerbations, but she is not been to the ED nor she been hospitalized since starting these monoclonal antibodies.  She contracted Covid in August 2020.  Interestingly it affected her kidneys rather than her lungs.  She was briefly hospitalized in the ICU but did not require ventilator.  She has been vaccinated but not boosted.  Given her tenuous autoimmune status, it is doubtful that the monoclonal antibodies will provide her  a great deal of protection against Covid.  Review of Systems: A complete review of systems was remarkable for patient is here to be seen for post-hypoxic encephalopathy. Mom reports that the patient has had a few episodes of bad shaking. She also reports that the patient has been complaining abouther neck and back hurting. Mom reports no other concerns at this time., all other systems  reviewed and negative.  Past Medical History Diagnosis Date  . Allergy   . Allergy    Multiple allergies - see allergy list  . Alopecia   . Asthma   . Asthma   . Family history of adverse reaction to anesthesia    Mom - Difficult to anesthesize/Hypotension  . Headache    With wheezing per Mom  . 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 Pyelonephritis 07/12/2019  . 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    Copied from prior chart See history of present illness from January 17, 2018.  EEG September 17, 2017 showed 7 Hz 35 V posterior dominant rhythm, normal anterior-posterior gradient, well organized background which is continuous and symmetric. There were no interictal or ictal waveforms.  MRI of the brain without contrast September 19, 2017 is not available for review but shows bilateral posterior frontal left greater than right areas of restricted diffusion corresponding with T2 and flair hyperintensity. There were no signal abnormalities in the deep gray matter or brainstem.  EEG September 22, 2017 showed a 5-6 Hz 40 V, and posterior rhythm with slight anterior posterior gradient and well-organized background continuous and symmetric with no interictal or ictal activity.  CPK (10/26) 2, 282, (10/27) 17,102, (10/27) 16,10936,327, (10/27) 44,002, (10/28) 29,970, (10/28) 24,137, (10/29) 17,109, (10/29) 10,312, (10/30) 6893, (10/31) 1, 806, (11/1) 581, (11/3) 3, 619 (11/5) 2, 112  Creatinine peaked at 1.41 (10/25) and normalized by 10/28 at 0.85  Glucoses were elevated related to stress and also corticosteroids, hemoglobin A1c 5.3  Lactic acid (10/25) 8.63, (10/25) 2.6, (10/26) 8.2, (10/26) 5.5, (10/26) 2.9, (10/26) 1.4  D-dimer (10/25) 10.51, (10/27) 1.41  Free T4 1.48, TSH 2.271 (11/8)  Birth History 9 Lbs. 0 oz. infant born at 9340 weeks gestational  age to a 20 year old g 2p 001 150female. Gestation wascomplicated byHyperemesis gravidarum Mother receivedPitocin and Epidural anesthesia Primarycesarean section Nursery Course wasuncomplicated Growth and Development wasrecalled asnormal  Behavior History none  Surgical History Procedure Laterality Date  . ADENOIDECTOMY    . ESOPHAGOGASTRODUODENOSCOPY (EGD) WITH PROPOFOL N/A 09/27/2017   Procedure: ESOPHAGOGASTRODUODENOSCOPY (EGD) WITH PROPOFOL;  Surgeon: Adelene AmasQuan, Richard, MD;  [facility];  Service: Gastroenterology;  Laterality: N/A;  . PEG PLACEMENT N/A 09/27/2017   Procedure: PERCUTANEOUS ENDOSCOPIC GASTROSTOMY (PEG) PLACEMENT;  Surgeon: Adelene AmasQuan, Richard, MD;  [facility];  Service: Gastroenterology;  Laterality: N/A;  . TONSILLECTOMY    . WISDOM TOOTH EXTRACTION     Family History family history includes Asthma in her mother and paternal grandmother; Cancer in her maternal grandfather, mother, and paternal grandmother; Depression in her brother; Diabetes in her maternal grandfather and maternal grandmother; Hypertension in her maternal grandfather and maternal grandmother; Vision loss in her mother. Family history is negative for migraines, seizures, intellectual disabilities, blindness, deafness, birth defects, chromosomal disorder, or autism.  Social History Socioeconomic History  . Marital status: Single  . Years of education:  1611  . Highest education level:  High school junior  Occupational History  . Not employed  Tobacco Use  . Smoking status: Never Smoker  . Smokeless tobacco: Never Used  . Tobacco comment: no smokers in the home  Vaping Use  . Vaping Use: Never used  Substance and Sexual Activity  . Alcohol use: No  . Drug use: No  . Sexual activity: Never    Comment: Metformin for irregular menstrual cycles  Social History Narrative    Trinisha is currently not in school    Patient lives at home with mom, dad, and brother.     Allergies Allergen Reactions  . Azithromycin Anaphylaxis  . Biaxin [Clarithromycin] Anaphylaxis  . Erythromycin Anaphylaxis  . Macrolides And Ketolides Anaphylaxis  . Nitrofurantoin Monohyd Macro Anaphylaxis  . Quinolones Anaphylaxis  . Troleandomycin Anaphylaxis  . Mango Flavor Hives   Physical Exam BP 130/80   Pulse 84   Ht 5' 4.5" (1.638 m)   Wt 236 lb 6.4 oz (107.2 kg)   BMI 39.95 kg/m   General: alert, well developed, obese, in no acute distress, brown hair, brown eyes, right handed Head: normocephalic, no dysmorphic features; her face is much less cushingoid Ears, Nose and Throat: Otoscopic: tympanic membranes normal; pharynx: oropharynx is pink without exudates or tonsillar hypertrophy Neck: supple, full range of motion, no cranial or cervical bruits Respiratory: auscultation clear Cardiovascular: no murmurs, pulses are normal Musculoskeletal: no skeletal deformities or apparent scoliosis Skin: no rashes or neurocutaneous lesions  Neurologic Exam  Mental Status: alert; oriented to person; knowledge is below normal for age; language is below normal, but is adequate for communicating thoughts, responding to my questions, and following commands; she has dysarthria but is intelligible Cranial Nerves: visual fields are full to double simultaneous stimuli; extraocular movements are full and conjugate; pupils are round, reactive to light; funduscopic examination shows sharp disc margins with normal vessels; symmetric facial strength; midline tongue and uvula; air conduction is greater than bone conduction bilaterally Motor: normal strength, tone and mass; clumsy but improving fine motor movements; no pronator drift, tremor was not evident today nor did I see any myoclonus; I did not see significant difference between her left side and right except she is more coordinated on the right Sensory: intact responses to cold, vibration, proprioception and stereognosis Coordination:  Mildly clumsy finger-to-nose, rapid repetitive alternating movements and finger apposition Gait and Station: broad-based, diplegic but stable gait and station; balance is fair Reflexes: symmetric and diminished bilaterally; no clonus; bilateral flexor plantar responses  Assessment 1.  Anoxic brain injury, G93.1. 2.  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  Action induced myoclonus, G25.3. 4.  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 5.  Muscle spasm of back, M62.830. 6.  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  E66.9, Z68.54 7.  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 8.  Poor sleep hygiene, Z72.821.  Discussion Alyzah continues to make slow progress neurologically.  As noted above I believe that she is stable enough and has adequate balance and coordination that she could operate a scooter safely.  Plan  We will continue to provide care to her between now when I retire August 21, 2021.  I am going to arrange for her to be seen by an adult neurologist.  Greater than 50% of a 30-minute visit was spent in counseling coordination of care concerning her multiple neurologic conditions and discussing transition of care issues.  We also had a face-to-face which is necessary for her to obtain a scooter.  I refilled the medications that I have prescribed for her.  Medication List   Accurate as of February 27, 2021 11:59 PM. If you have any questions, ask your nurse or doctor.    albuterol 108 (90 Base) MCG/ACT inhaler Commonly known as: ProAir HFA Inhale 2 puffs into the lungs every 4 (four) hours as needed for wheezing or shortness of breath.   Amantadine HCl 100 MG tablet Take 1-1/2 tablets in the morning and 1 tablet at lunchtime   baclofen 20 MG tablet Commonly known as: LIORESAL Take 1 tablet (20 mg total) by mouth 3 (three) times daily.   budesonide-formoterol 160-4.5 MCG/ACT inhaler Commonly known as: Symbicort Inhale 2 puffs into the lungs 2 (two) times daily.   cetirizine 10 MG tablet Commonly known as:  ZYRTEC Take 10 mg by mouth daily.   Clobetasol Propionate 0.05 % shampoo Apply 1 application topically 2 (two) times daily. What changed:   when to take this  reasons to take this   clobetasol ointment 0.05 % Commonly known as: TEMOVATE Apply 1 application topically 2 (two) times daily. What changed:   when to take this  reasons to take this   Clobetasol Propionate 0.05 % shampoo Shampoo twice daily for two weeks What changed: Another medication with the same name was changed. Make sure you understand how and when to take each.   Clobex 0.05 % shampoo Generic drug: Clobetasol Propionate Apply 1 application topically 2 (two) times daily. What changed: Another medication with the same name was changed. Make sure you understand how and when to take each.   clonazePAM 0.5 MG tablet Commonly known as: KLONOPIN TAKE TWO TABLETS BY MOUTH TWICE A DAY   cloNIDine 0.1 MG tablet Commonly known as: CATAPRES TAKE ONE TABLET BY MOUTH TWICE A DAY *MAKE APPT FOR FURTHER REFILLS*   EPINEPHrine 0.3 mg/0.3 mL Soaj injection Commonly known as: EPI-PEN Inject 0.3 mLs (0.3 mg total) into the muscle once as needed for anaphylaxis.   Eucrisa 2 % Oint Generic drug: Crisaborole APPLY ONE APPLICATION TOPICALLY TWICE A DAY (TO THE FACE)   Fasenra 30 MG/ML Sosy Generic drug: Benralizumab INJECT 30 MG INTO THE SKIN EVERY 8 WEEKS.   Flovent HFA 110 MCG/ACT inhaler Generic drug: fluticasone INHALE TWO PUFFS BY MOUTH INTO THE LUNGS TWICE A DAY   gabapentin 300 MG capsule Commonly known as: NEURONTIN Take 1 capsule (300 mg total) by mouth 3 (three) times daily.   ipratropium 0.02 % nebulizer solution Commonly known as: ATROVENT Inhale 0.5 mg into the lungs every 6 (six) hours as needed for wheezing or shortness of breath.   ketoconazole 2 % shampoo Commonly known as: NIZORAL Shampoo every 3-4 days for 8 weeks, then use prn What changed:   how much to take  how to take this  when  to take this  additional instructions   levETIRAcetam 500 MG tablet Commonly known as: KEPPRA Take 1 tablet (500 mg total) by mouth 2 (two) times daily.   levocetirizine 5 MG tablet Commonly known as: XYZAL Take 1 tablet (5 mg total) by mouth every evening.   metFORMIN 500 MG tablet Commonly known as: GLUCOPHAGE TAKE 1 TABLET (500 MG TOTAL) BY MOUTH DAILY.   methocarbamol 500 MG tablet Commonly known as: Robaxin Take 1 tablet (500 mg total) by mouth once as needed for up to 2 doses for muscle spasms.   montelukast 10 MG tablet Commonly known as: SINGULAIR Take 1 tablet (10 mg total) by mouth at bedtime.   omeprazole 20 MG capsule Commonly known as: PRILOSEC Take  1 capsule (20 mg total) by mouth daily.   predniSONE 10 MG tablet Commonly known as: DELTASONE Take two tablets (20mg ) twice daily for three days, then one tablet (10mg ) twice daily for three days, then STOP.   traZODone 50 MG tablet Commonly known as: DESYREL TAKE TWO TABLETS BY MOUTH BEFORE BEDTIME   triamcinolone ointment 0.1 % Commonly known as: KENALOG Apply topically 2 (two) times daily.    The medication list was reviewed and reconciled. All changes or newly prescribed medications were explained.  A complete medication list was provided to the patient/caregiver.  MD

## 2021-02-27 NOTE — Patient Instructions (Signed)
Was a pleasure to see you today.  I am glad that [name] is doing so well.  I will document in my notes the medical necessity for a scooter for her.  He have to go to your physical therapist have them make recommendations for the model and for what ever specific additional hardware needs to go with it.  This needs to come to me along with a brief note stating medical necessity and the durable medical goods pharmacy of [place].  I will refer to my note and to the physical therapist note and signed all relevant forms and then we can send it back to the durable medical goods place so that she can purchase the scooter.  She will see an adult neurologist about 6 months from now we will try to arrange this personally so that the doctor knows the magnitude of her problems and why I think a neurologist needs to continue to follow her and it should be an adult neurologist.  I will continue to provide care for [name] until I retire August 21, 2021.

## 2021-03-04 ENCOUNTER — Telehealth: Payer: Self-pay | Admitting: *Deleted

## 2021-03-04 NOTE — Telephone Encounter (Signed)
L/M for patient that Dupixent due for [place] and per Dr Dellis Anes can go ahead and start therapy instead of using [facility] since she is behind in dosing anyway.

## 2021-03-09 ENCOUNTER — Telehealth: Payer: Self-pay

## 2021-03-09 MED ORDER — ALBUTEROL SULFATE (2.5 MG/3ML) 0.083% IN NEBU: 2.5000 mg | INHALATION_SOLUTION | RESPIRATORY_TRACT | 1 refills | Status: DC | PRN

## 2021-03-09 NOTE — Telephone Encounter (Signed)
Patient called asking for a refill for her Albuterol nebulizer. Solution has been sent into [facility]

## 2021-03-19 ENCOUNTER — Telehealth (INDEPENDENT_AMBULATORY_CARE_PROVIDER_SITE_OTHER): Payer: Self-pay | Admitting: Pediatrics

## 2021-03-19 DIAGNOSIS — R269 Unspecified abnormalities of gait and mobility: Secondary | ICD-10-CM

## 2021-03-19 DIAGNOSIS — G8194 Hemiplegia, unspecified affecting left nondominant side: Secondary | ICD-10-CM

## 2021-03-19 NOTE — Telephone Encounter (Signed)
  Who's calling (name and relationship to patient) : [name]- mom  Best contact number: [phone]  Provider they see: Dr. Sharene Skeans  Reason for call: Mom states that [facility] to face visit to qualify for power wheelchair.   PRESCRIPTION REFILL ONLY  Name of prescription:  Pharmacy:

## 2021-03-20 NOTE — Telephone Encounter (Signed)
Order has been written.

## 2021-03-23 NOTE — Telephone Encounter (Signed)
Referral has been routed.

## 2021-03-26 ENCOUNTER — Encounter (INDEPENDENT_AMBULATORY_CARE_PROVIDER_SITE_OTHER): Payer: Self-pay

## 2021-03-30 ENCOUNTER — Other Ambulatory Visit (INDEPENDENT_AMBULATORY_CARE_PROVIDER_SITE_OTHER): Payer: Self-pay | Admitting: Pediatrics

## 2021-03-30 DIAGNOSIS — G252 Other specified forms of tremor: Secondary | ICD-10-CM

## 2021-03-30 DIAGNOSIS — G8194 Hemiplegia, unspecified affecting left nondominant side: Secondary | ICD-10-CM

## 2021-03-30 DIAGNOSIS — R252 Cramp and spasm: Secondary | ICD-10-CM

## 2021-04-11 ENCOUNTER — Telehealth: Payer: Self-pay | Admitting: Allergy

## 2021-04-11 MED ORDER — ALBUTEROL SULFATE HFA 108 (90 BASE) MCG/ACT IN AERS: 2.0000 | INHALATION_SPRAY | RESPIRATORY_TRACT | 1 refills | Status: DC | PRN

## 2021-04-11 NOTE — Telephone Encounter (Signed)
Mother requesting proair refill as they are leaving town and she doesn't have any.   Sent Rx to pharmacy.

## 2021-04-14 ENCOUNTER — Ambulatory Visit: Payer: Self-pay

## 2021-04-15 ENCOUNTER — Telehealth: Payer: Self-pay | Admitting: *Deleted

## 2021-04-15 NOTE — Telephone Encounter (Signed)
Called mom and left voicemail message to call me back at the office.  Need to reschedule appointment for patient to start Dupixent.  Patient did not show up for her appointment to start Dupixent on 04/14/21.   Also need to confirm with Mom ProAir Inhaler and Albuterol neb and how often she is having to use. Per pharmacist at [facility], ProAir was only picked up on 04/11/21 and not picked up in April.

## 2021-04-17 NOTE — Telephone Encounter (Signed)
Thank you for staying on top of that.  Malachi Bonds, MD [facility]

## 2021-04-24 NOTE — Telephone Encounter (Signed)
Mom called and spoke with [name] and made appointment 04/28/21 for Dupixent injection.

## 2021-04-28 ENCOUNTER — Ambulatory Visit: Payer: Medicaid Other

## 2021-05-05 NOTE — Telephone Encounter (Signed)
Patient did not show for appointment on 04/28/21 to start Dupixent. Called mom [facility]) and left message to call office to give update on how Nobie was doing and to reschedule appointment to start Dupixent.   Dr. Dellis Anes notified of missed appointment and call to mom for update.

## 2021-05-05 NOTE — Telephone Encounter (Signed)
Thank you for doing that.  I do not think that she needs to be getting her injections at home ever. We need to keep on eye on her.  Malachi Bonds, MD [facility]

## 2021-05-18 ENCOUNTER — Ambulatory Visit: Payer: Self-pay

## 2021-05-20 ENCOUNTER — Telehealth: Payer: Self-pay | Admitting: Allergy & Immunology

## 2021-05-20 MED ORDER — EUCRISA 2 % EX OINT: TOPICAL_OINTMENT | CUTANEOUS | 2 refills | Status: DC

## 2021-05-20 MED ORDER — FLUTICASONE PROPIONATE HFA 110 MCG/ACT IN AERO: INHALATION_SPRAY | RESPIRATORY_TRACT | 2 refills | Status: DC

## 2021-05-20 MED ORDER — MONTELUKAST SODIUM 10 MG PO TABS: 10.0000 mg | ORAL_TABLET | Freq: Every day | ORAL | 2 refills | Status: DC

## 2021-05-20 NOTE — Telephone Encounter (Signed)
Pt's mother called requesting refills for patient's flovent, montelukast and eucrisa.   [facility] - [address] [place], [place] [address]  Best Contact number is [phone]

## 2021-05-20 NOTE — Telephone Encounter (Signed)
Refills of montelukast, eucrisa, and flovent sent in to [facility]

## 2021-05-21 ENCOUNTER — Other Ambulatory Visit: Payer: Self-pay

## 2021-05-21 ENCOUNTER — Ambulatory Visit (INDEPENDENT_AMBULATORY_CARE_PROVIDER_SITE_OTHER): Payer: Medicaid Other | Admitting: *Deleted

## 2021-05-21 DIAGNOSIS — L209 Atopic dermatitis, unspecified: Secondary | ICD-10-CM | POA: Diagnosis not present

## 2021-05-21 DIAGNOSIS — J4550 Severe persistent asthma, uncomplicated: Secondary | ICD-10-CM

## 2021-05-21 MED ORDER — DUPILUMAB 300 MG/2ML ~~LOC~~ SOSY
300.0000 mg | PREFILLED_SYRINGE | SUBCUTANEOUS | Status: AC
  Administered 2021-05-21 – 2022-04-01 (×8): 300 mg via SUBCUTANEOUS

## 2021-05-21 NOTE — Telephone Encounter (Signed)
Scheduled patient for follow up visit with Dr. Dellis Anes, informed mother that she needs to keep this appointment to receive further refills. Patient's mother verbalized understanding.

## 2021-05-21 NOTE — Progress Notes (Signed)
Immunotherapy   Patient Details  Name: JAID QUIRION MRN: [id number] Date of Birth: 2001-05-02  05/21/2021  Sheletha P Arora started injections for  Dupixent  Frequency: Every 2 Weeks  Epi-Pen:Epi-Pen Available  Consent signed and patient instructions given.  Patient started Dupixent today and received 600mg  loading dose. Patient waited 30 minutes and did not experience any issues.    Ladainian Therien Fernandez-Vernon 05/21/2021, 11:57 AM

## 2021-05-26 ENCOUNTER — Other Ambulatory Visit: Payer: Self-pay | Admitting: Allergy

## 2021-06-04 ENCOUNTER — Ambulatory Visit (INDEPENDENT_AMBULATORY_CARE_PROVIDER_SITE_OTHER): Payer: Medicaid Other | Admitting: *Deleted

## 2021-06-04 ENCOUNTER — Other Ambulatory Visit: Payer: Self-pay

## 2021-06-04 DIAGNOSIS — J4550 Severe persistent asthma, uncomplicated: Secondary | ICD-10-CM

## 2021-06-09 ENCOUNTER — Ambulatory Visit: Payer: Medicaid Other | Attending: Pediatrics | Admitting: Physical Therapy

## 2021-06-09 ENCOUNTER — Other Ambulatory Visit: Payer: Self-pay

## 2021-06-09 DIAGNOSIS — R2681 Unsteadiness on feet: Secondary | ICD-10-CM | POA: Diagnosis present

## 2021-06-09 DIAGNOSIS — R2689 Other abnormalities of gait and mobility: Secondary | ICD-10-CM | POA: Diagnosis present

## 2021-06-09 DIAGNOSIS — M6281 Muscle weakness (generalized): Secondary | ICD-10-CM

## 2021-06-10 ENCOUNTER — Encounter: Payer: Self-pay | Admitting: Physical Therapy

## 2021-06-10 NOTE — Therapy (Signed)
[facility] [address] [place], [place], [address] Phone: [phone]   Fax:  [phone]  Physical Therapy Evaluation  Patient Details  Name: [name] MRN: [id number] Date of Birth: [birth year] Referring Provider (PT): Ellison Carwin, MD   Encounter Date: 06/09/2021   PT End of Session - 06/10/21 1519     Visit Number 1    Authorization Type Medicaid    PT Start Time 1405    PT Stop Time 1500    PT Time Calculation (min) 55 min    Activity Tolerance Patient tolerated treatment well    Behavior During Therapy [facility] for tasks assessed/performed             Past Medical History:  Diagnosis Date   Allergy    Allergy    Multiple allergies - see allergy list   Alopecia    Asthma    Asthma    Family history of adverse reaction to anesthesia    Mom - Difficult to anesthesize/Hypotension   Headache    With wheezing per Mom   Obesity    Steroid induced per Mom   Pyelonephritis 07/12/2019   Vision abnormalities    Diminished vision Left Eye per Mom    Past Surgical History:  Procedure Laterality Date   ADENOIDECTOMY     ESOPHAGOGASTRODUODENOSCOPY (EGD) WITH PROPOFOL N/A 09/27/2017   Procedure: ESOPHAGOGASTRODUODENOSCOPY (EGD) WITH PROPOFOL;  Surgeon: Adelene Amas, MD;  Location: [facility] ENDOSCOPY;  Service: Gastroenterology;  Laterality: N/A;   PEG PLACEMENT N/A 09/27/2017   Procedure: PERCUTANEOUS ENDOSCOPIC GASTROSTOMY (PEG) PLACEMENT;  Surgeon: Adelene Amas, MD;  Location: [facility] ENDOSCOPY;  Service: Gastroenterology;  Laterality: N/A;   TONSILLECTOMY     WISDOM TOOTH EXTRACTION      There were no vitals filed for this visit.    Subjective Assessment - 06/10/21 1517     Subjective Pt presents to PT for scooter evaluation - pt is using rollator for assistance with ambulation; mother states she does not want a power wheelchair    Currently in Pain? No/denies                [facility] PT Assessment  - 06/10/21 0001       Assessment   Medical Diagnosis Hypoxic brain injury due to cardiac arrest due to severe asthma     Referring Provider (PT) Ellison Carwin, MD    Onset Date/Surgical Date 09/15/17      Precautions   Precautions Fall      Prior Function   Level of Independence Independent with household mobility with device;Requires assistive device for independence                        Objective measurements completed on examination: See above findings.      LMN for power scooter to be completed; vendor Arne Cleveland present for eval (with [employer])                       Plan - 06/10/21 1519     Clinical Impression Statement Pt seen for scooter evaluation with vendor Arne Cleveland, with [employer]; LMN to be completed for this equipment    PT Frequency One time visit    Consulted and Agree with Plan of Care Patient;Family member/caregiver    Family Member Consulted Mother [name]             Patient will benefit from skilled therapeutic intervention in order  to improve the following deficits and impairments:     Visit Diagnosis: Other abnormalities of gait and mobility - Plan: PT plan of care cert/re-cert  Muscle weakness (generalized) - Plan: PT plan of care cert/re-cert  Unsteadiness on feet - Plan: PT plan of care cert/re-cert     Problem List Patient Active Problem List   Diagnosis Date Noted   Muscle spasm of back 07/14/2019   Pyelonephritis 07/12/2019   COVID-19 07/12/2019   Peripheral neuropathy 08/24/2018   Poor sleep hygiene 07/11/2018   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   Left hemiparesis (HCC) 01/17/2018   Abnormality on bone densitometry 11/28/2017   Anoxic brain injury (HCC) 09/28/2017   Sympathetic storming 09/28/2017   Spasticity 09/28/2017   Encephalopathy 09/19/2017   PCOS (polycystic ovarian syndrome) 09/29/2016   Low bone  density 09/29/2016   Drug-induced obesity 12/19/2015   Severe asthma with acute exacerbation 07/26/2015   Seasonal and perennial allergic rhinitis 07/26/2015   Obesity peds (BMI >=95 percentile) 02/29/2012    Waldemar Siegel, Donavan Burnet, PT 06/10/2021, 3:23 PM  [facility] [address] [place], [place], [address] Phone: [phone]   Fax:  [phone]  Name: ELESIA PEMBERTON MRN: [id number] Date of Birth: [birth year]

## 2021-06-18 ENCOUNTER — Ambulatory Visit (INDEPENDENT_AMBULATORY_CARE_PROVIDER_SITE_OTHER): Payer: Medicaid Other | Admitting: *Deleted

## 2021-06-18 ENCOUNTER — Other Ambulatory Visit: Payer: Self-pay

## 2021-06-18 DIAGNOSIS — J4550 Severe persistent asthma, uncomplicated: Secondary | ICD-10-CM | POA: Diagnosis not present

## 2021-07-02 ENCOUNTER — Ambulatory Visit (INDEPENDENT_AMBULATORY_CARE_PROVIDER_SITE_OTHER): Payer: Medicaid Other | Admitting: *Deleted

## 2021-07-02 ENCOUNTER — Other Ambulatory Visit: Payer: Self-pay

## 2021-07-02 DIAGNOSIS — J4550 Severe persistent asthma, uncomplicated: Secondary | ICD-10-CM

## 2021-07-09 ENCOUNTER — Ambulatory Visit: Payer: Self-pay | Admitting: Allergy & Immunology

## 2021-07-09 ENCOUNTER — Telehealth: Payer: Self-pay | Admitting: *Deleted

## 2021-07-09 NOTE — Telephone Encounter (Signed)
L/m for patient to contact [place] for MD appt for Dupixent reapproval

## 2021-07-16 ENCOUNTER — Ambulatory Visit: Payer: Medicaid Other | Admitting: Allergy & Immunology

## 2021-07-17 ENCOUNTER — Ambulatory Visit: Payer: Medicaid Other

## 2021-07-22 ENCOUNTER — Telehealth (INDEPENDENT_AMBULATORY_CARE_PROVIDER_SITE_OTHER): Payer: Self-pay | Admitting: Pediatrics

## 2021-07-22 DIAGNOSIS — G8194 Hemiplegia, unspecified affecting left nondominant side: Secondary | ICD-10-CM

## 2021-07-22 DIAGNOSIS — R269 Unspecified abnormalities of gait and mobility: Secondary | ICD-10-CM

## 2021-07-22 NOTE — Telephone Encounter (Signed)
Order for electronic wheelchair written.

## 2021-08-05 ENCOUNTER — Ambulatory Visit (INDEPENDENT_AMBULATORY_CARE_PROVIDER_SITE_OTHER): Payer: Medicaid Other

## 2021-08-05 ENCOUNTER — Ambulatory Visit (INDEPENDENT_AMBULATORY_CARE_PROVIDER_SITE_OTHER): Payer: Medicaid Other | Admitting: Pediatrics

## 2021-08-05 ENCOUNTER — Encounter (INDEPENDENT_AMBULATORY_CARE_PROVIDER_SITE_OTHER): Payer: Self-pay | Admitting: Pediatrics

## 2021-08-05 ENCOUNTER — Other Ambulatory Visit: Payer: Self-pay

## 2021-08-05 VITALS — BP 98/74 | HR 88 | Wt 234.0 lb

## 2021-08-05 DIAGNOSIS — M62830 Muscle spasm of back: Secondary | ICD-10-CM

## 2021-08-05 DIAGNOSIS — G253 Myoclonus: Secondary | ICD-10-CM

## 2021-08-05 DIAGNOSIS — G252 Other specified forms of tremor: Secondary | ICD-10-CM

## 2021-08-05 DIAGNOSIS — G931 Anoxic brain damage, not elsewhere classified: Secondary | ICD-10-CM

## 2021-08-05 DIAGNOSIS — R269 Unspecified abnormalities of gait and mobility: Secondary | ICD-10-CM | POA: Diagnosis not present

## 2021-08-05 DIAGNOSIS — R252 Cramp and spasm: Secondary | ICD-10-CM

## 2021-08-05 DIAGNOSIS — Z72821 Inadequate sleep hygiene: Secondary | ICD-10-CM

## 2021-08-05 DIAGNOSIS — G8194 Hemiplegia, unspecified affecting left nondominant side: Secondary | ICD-10-CM

## 2021-08-05 DIAGNOSIS — J4550 Severe persistent asthma, uncomplicated: Secondary | ICD-10-CM

## 2021-08-05 MED ORDER — CLONAZEPAM 0.5 MG PO TABS: 1.0000 mg | ORAL_TABLET | Freq: Two times a day (BID) | ORAL | 5 refills | Status: DC

## 2021-08-05 MED ORDER — GABAPENTIN 300 MG PO CAPS: 300.0000 mg | ORAL_CAPSULE | Freq: Three times a day (TID) | ORAL | 5 refills | Status: DC

## 2021-08-05 MED ORDER — TRAZODONE HCL 50 MG PO TABS: ORAL_TABLET | ORAL | 5 refills | Status: DC

## 2021-08-05 MED ORDER — LEVETIRACETAM 500 MG PO TABS: 500.0000 mg | ORAL_TABLET | Freq: Two times a day (BID) | ORAL | 5 refills | Status: DC

## 2021-08-05 MED ORDER — AMANTADINE HCL 100 MG PO CAPS: ORAL_CAPSULE | ORAL | 4 refills | Status: AC

## 2021-08-05 MED ORDER — BACLOFEN 20 MG PO TABS: 20.0000 mg | ORAL_TABLET | Freq: Three times a day (TID) | ORAL | 5 refills | Status: DC

## 2021-08-05 NOTE — Progress Notes (Signed)
Patient: [name] MRN: [id number] Sex: female DOB: 08/07/2001  Provider: Ellison Carwin, MD Location of Care: [facility] Child [facility]  Note type: Routine return visit  History of Present Illness: Referral Source: [facility] History from: patient and [facility] chart Chief Complaint: Remote effects of anoxic encephalopathy neurologic gait disorder  [name] is a 20 y.o. 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  She was hospitalized in intensive care with prolonged intubation, rhabdomyolysis with acute renal failure, neurologic "storms" was associated with tachycardia, fever, and hypertension.  She was treated with chronic corticosteroids to suppress her asthma attacks and developed hyperglycemia and obesity.  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  She has made slow steady progress over time.  She can speak sentences with dysarthria and halting speech.  It is hard to understand her when the mask is covering her face.  She understands most of what is said to her.  Her stamina for walking has markedly improved.  She is coming in a wheelchair on previous visits but came with her walker today as she did last time.  She uses a walker when she goes out and sits on a seat in the walker when tired.  She has problems with balance which is why she needs a walker but she appears to be quite stable with it.  Her asthma has markedly improved with the use of a monoclonal antibody, Dupixent.  She has not been to the emergency department nor been hospitalized since being treated with monoclonal antibodies for her asthma.  She contracted COVID in August 2020 which affected her kidneys and required brief hospitalization ICU.  She has been vaccinated but not boosted.  Kidney function has improved.  Her general health is good  except for her obesity.  She is very alert today and responsive.  I find her continued improvement over nearly 4 years to be remarkable.  She is independent in dressing, feeding, and her basic hygiene.  The family took a trip to [place] this summer and she was able to get out onto the beach.  Review of Systems: A complete review of systems was negative except as noted above and below.  Past Medical History Past Medical History:  Diagnosis Date   Allergy    Allergy    Multiple allergies - see allergy list   Alopecia    Asthma    Asthma    Family history of adverse reaction to anesthesia    Mom - Difficult to anesthesize/Hypotension   Headache    With wheezing per Mom   Obesity    Steroid induced per Mom   Pyelonephritis 07/12/2019   Vision abnormalities    Diminished vision Left Eye per Mom   Hospitalizations: No., Head Injury: No., Nervous System Infections: No., Immunizations up to date: Yes.    Copied from prior chart See history of present illness from January 17, 2018.   EEG September 17, 2017 showed 7 Hz 35 V posterior dominant rhythm, normal anterior-posterior gradient, well organized background which is continuous and symmetric.  There were no interictal or ictal waveforms.   MRI of the brain without contrast September 19, 2017 is not available for review but shows bilateral posterior frontal left greater than right areas of restricted diffusion corresponding with T2 and flair hyperintensity.  There were no signal abnormalities in the  deep gray matter or brainstem.   EEG September 22, 2017 showed a 5-6 Hz 40 V, and posterior rhythm with slight anterior posterior gradient and well-organized background continuous and symmetric with no interictal or ictal activity.   CPK (10/26) 2, 282, (10/27) 17,102, (10/27) 28,413, (10/27) 44,002, (10/28) 29,970, (10/28) 24,137, (10/29) 17,109, (10/29) 10,312, (10/30) 6893, (10/31) 1, 806, (11/1) 581, (11/3) 3, 619 (11/5) 2, 112    Creatinine peaked at 1.41 (10/25) and normalized by 10/28 at 0.85   Glucoses were elevated related to stress and also corticosteroids, hemoglobin A1c 5.3   Lactic acid (10/25) 8.63, (10/25) 2.6, (10/26) 8.2, (10/26) 5.5, (10/26) 2.9, (10/26) 1.4   D-dimer (10/25) 10.51, (10/27) 1.41   Free T4 1.48, TSH 2.271 (11/8)   Birth History 9 Lbs.  0 oz. infant born at [redacted] weeks gestational age to a 20 year old g 2 p 0 0 1 0 female. Gestation was complicated by Hyperemesis gravidarum Mother received Pitocin and Epidural anesthesia  Primary cesarean section Nursery Course was uncomplicated Growth and Development was recalled as  normal   Behavior History none  Surgical History Procedure Laterality Date   ADENOIDECTOMY     ESOPHAGOGASTRODUODENOSCOPY (EGD) WITH PROPOFOL N/A 09/27/2017   Procedure: ESOPHAGOGASTRODUODENOSCOPY (EGD) WITH PROPOFOL;  Surgeon: Adelene Amas, MD;  Location: [facility] ENDOSCOPY;  Service: Gastroenterology;  Laterality: N/A;   PEG PLACEMENT N/A 09/27/2017   Procedure: PERCUTANEOUS ENDOSCOPIC GASTROSTOMY (PEG) PLACEMENT;  Surgeon: Adelene Amas, MD;  Location: [facility] ENDOSCOPY;  Service: Gastroenterology;  Laterality: N/A;   TONSILLECTOMY     WISDOM TOOTH EXTRACTION     Family History family history includes Asthma in her mother and paternal grandmother; Cancer in her maternal grandfather, mother, and paternal grandmother; Depression in her brother; Diabetes in her maternal grandfather and maternal grandmother; Hypertension in her maternal grandfather and maternal grandmother; Vision loss in her mother. Family history is negative for migraines, seizures, intellectual disabilities, blindness, deafness, birth defects, chromosomal disorder, or autism.  Social History Socioeconomic History   Marital status: Single   Years of education: 11   Highest education level: High school junior  Occupational History   Not employed  Tobacco Use   Smoking status: Never   Smokeless tobacco:  Never   Tobacco comments:    no smokers in the home  Vaping Use   Vaping Use: Never used  Substance and Sexual Activity   Alcohol use: No   Drug use: No   Sexual activity: Never    Comment: Metformin for irregular menstrual cycles  Social History Narrative   Zaria is currently not in school   Patient lives at home with mom, dad, and brother.    Allergies Allergen Reactions   Azithromycin Anaphylaxis   Biaxin [Clarithromycin] Anaphylaxis   Erythromycin Anaphylaxis   Macrolides And Ketolides Anaphylaxis   Nitrofurantoin Monohyd Macro Anaphylaxis   Quinolones Anaphylaxis   Troleandomycin Anaphylaxis   Mango Flavor Hives   Physical Exam BP 98/74   Pulse 88   Wt 234 lb (106.1 kg)   BMI 39.55 kg/m   General: alert, well developed, obese in no acute distress, brown hair, brown eyes, right handed Head: normocephalic, no dysmorphic features Ears, Nose and Throat: Otoscopic: tympanic membranes normal; pharynx: oropharynx is pink without exudates or tonsillar hypertrophy Neck: supple, full range of motion, no cranial or cervical bruits Respiratory: auscultation clear Cardiovascular: no murmurs, pulses are normal Musculoskeletal: no skeletal deformities or apparent scoliosis Skin: no rashes or neurocutaneous lesions  Neurologic Exam  Mental Status:  alert; oriented to person; knowledge is below normal for age; language is below normal; she is able to follow commands.  It is hard to understand her today with her dysarthria and mask; she has a mixture of nasal and guttural speech. Cranial Nerves: visual fields are full to double simultaneous stimuli; extraocular movements are full and conjugate; pupils are round reactive to light; funduscopic examination shows sharp disc margins with normal vessels; symmetric facial strength; midline tongue; air conduction is greater than bone conduction bilaterally Motor: normal strength, tone and mass; clumsy fine motor movements; no pronator  drift Sensory: intact responses to cold, vibration, proprioception and stereognosis Coordination: clumsy finger-to-nose, rapid repetitive alternating movements and finger apposition Gait and Station: broad-based, diplegic gait and station Reflexes: symmetric and diminished bilaterally; no clonus; bilateral flexor plantar responses   Assessment 1.  Anoxic brain injury, G93.1. 2.  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  Action induced myoclonus, G25.3. 4.  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 5.  Muscle spasm of back, M62.830. 6.  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  E66.9, Z68.54 7.  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 8.  Poor sleep hygiene, Z72.821.  Discussion Though Tayjah has many sequelae from her hypoxic insult, she continues to make progress that is evident and gratifying.  Plan I made no changes in her treatment.  Greater than 50% of a 30-minute visit was spent counseling coordination of care concerning her multiple neurologic issues.  I decided that for the time being she would be better off staying within this practice.  Though I understand that my adult colleagues see patients with hypoxic ischemic encephalopathy, I know the family is going to feel better being followed in our office.  I told him that that could change based on the circumstances within the clinic.  Prescriptions were refilled for levetiracetam, clonazepam, gabapentin,, baclofen, and trazodone.  While there are other medications that I have prescribed, they have not been prescribed in some time and so I did not refill them.  This would include clonidine and metformin.   Medication List    Accurate as of August 05, 2021 11:59 PM. If you have any questions, ask your nurse or doctor.     albuterol (2.5 MG/3ML) 0.083% nebulizer solution Commonly known as: PROVENTIL Take 3 mLs (2.5 mg total) by nebulization every 4 (four) hours as needed for wheezing or shortness of breath.   ProAir HFA 108 (90 Base) MCG/ACT inhaler Generic  drug: albuterol INHALE TWO PUFFS INTO THE LUNGS EVERY 4 (FOUR) HOURS AS NEEDED FOR WHEEZING OR SHORTNESS OF BREATH.   amantadine 100 MG capsule Commonly known as: SYMMETREL TAKE 1-1/2 (capsules non breakable give 1 or 2) IN THE MORNING AND 1 capsule AT LUNCHTIME What changed: Another medication with the same name was removed. Continue taking this medication, and follow the directions you see here. Changed by: Ellison Carwin, MD   baclofen 20 MG tablet Commonly known as: LIORESAL Take 1 tablet (20 mg total) by mouth 3 (three) times daily.   budesonide-formoterol 160-4.5 MCG/ACT inhaler Commonly known as: Symbicort Inhale 2 puffs into the lungs 2 (two) times daily.   cetirizine 10 MG tablet Commonly known as: ZYRTEC Take 10 mg by mouth daily.   Clobetasol Propionate 0.05 % shampoo Apply 1 application topically 2 (two) times daily. What changed:  when to take this reasons to take this   clobetasol ointment 0.05 % Commonly known as: TEMOVATE Apply 1 application topically 2 (two) times daily. What changed:  when to take this reasons to take this  Clobetasol Propionate 0.05 % shampoo Shampoo twice daily for two weeks What changed: Another medication with the same name was changed. Make sure you understand how and when to take each.   Clobex 0.05 % shampoo Generic drug: Clobetasol Propionate Apply 1 application topically 2 (two) times daily. What changed: Another medication with the same name was changed. Make sure you understand how and when to take each.   clonazePAM 0.5 MG tablet Commonly known as: KLONOPIN Take 2 tablets (1 mg total) by mouth 2 (two) times daily.   cloNIDine 0.1 MG tablet Commonly known as: CATAPRES TAKE ONE TABLET BY MOUTH TWICE A DAY *MAKE APPT FOR FURTHER REFILLS*   EPINEPHrine 0.3 mg/0.3 mL Soaj injection Commonly known as: EPI-PEN Inject 0.3 mLs (0.3 mg total) into the muscle once as needed for anaphylaxis.   Eucrisa 2 % Oint Generic drug:  Crisaborole APPLY ONE APPLICATION TOPICALLY TWICE A DAY (TO THE FACE)   Fasenra 30 MG/ML Sosy Generic drug: Benralizumab INJECT 30 MG INTO THE SKIN EVERY 8 WEEKS.   fluticasone 110 MCG/ACT inhaler Commonly known as: Flovent HFA INHALE TWO PUFFS BY MOUTH INTO THE LUNGS TWICE A DAY   gabapentin 300 MG capsule Commonly known as: NEURONTIN Take 1 capsule (300 mg total) by mouth 3 (three) times daily.   ipratropium 0.02 % nebulizer solution Commonly known as: ATROVENT Inhale 0.5 mg into the lungs every 6 (six) hours as needed for wheezing or shortness of breath.   ketoconazole 2 % shampoo Commonly known as: NIZORAL Shampoo every 3-4 days for 8 weeks, then use prn What changed:  how much to take how to take this when to take this additional instructions   levETIRAcetam 500 MG tablet Commonly known as: KEPPRA Take 1 tablet (500 mg total) by mouth 2 (two) times daily.   levocetirizine 5 MG tablet Commonly known as: XYZAL Take 1 tablet (5 mg total) by mouth every evening.   metFORMIN 500 MG tablet Commonly known as: GLUCOPHAGE TAKE 1 TABLET (500 MG TOTAL) BY MOUTH DAILY.   methocarbamol 500 MG tablet Commonly known as: Robaxin Take 1 tablet (500 mg total) by mouth once as needed for up to 2 doses for muscle spasms.   montelukast 10 MG tablet Commonly known as: SINGULAIR Take 1 tablet (10 mg total) by mouth at bedtime.   omeprazole 20 MG capsule Commonly known as: PRILOSEC Take 1 capsule (20 mg total) by mouth daily.   predniSONE 10 MG tablet Commonly known as: DELTASONE Take two tablets (20mg ) twice daily for three days, then one tablet (10mg ) twice daily for three days, then STOP.   traZODone 50 MG tablet Commonly known as: DESYREL TAKE TWO TABLETS BY MOUTH BEFORE BEDTIME   triamcinolone ointment 0.1 % Commonly known as: KENALOG Apply topically 2 (two) times daily.     The medication list was reviewed and reconciled. All changes or newly prescribed medications  were explained.  A complete medication list was provided to the patient/caregiver.  MD

## 2021-08-05 NOTE — Patient Instructions (Signed)
Thank you for coming today.  We will have you return in 4 to 6 months time to be seen by any of my colleagues.  I refilled prescriptions for the medications that we have filled.  Please let me know if there is any other help that I can give you between now and when I retire.  It has been a pleasure to take care of you and to see the great progress that you have made in overcoming the injury to your brain caused by your asthma.

## 2021-08-21 ENCOUNTER — Ambulatory Visit: Payer: Medicaid Other

## 2021-08-26 ENCOUNTER — Ambulatory Visit: Payer: Medicaid Other

## 2021-08-28 ENCOUNTER — Ambulatory Visit: Payer: Medicaid Other

## 2021-09-03 ENCOUNTER — Other Ambulatory Visit: Payer: Self-pay | Admitting: Allergy

## 2021-09-08 ENCOUNTER — Telehealth (INDEPENDENT_AMBULATORY_CARE_PROVIDER_SITE_OTHER): Payer: Self-pay

## 2021-09-08 ENCOUNTER — Ambulatory Visit: Payer: Medicaid Other | Admitting: Allergy & Immunology

## 2021-09-08 NOTE — Telephone Encounter (Signed)
Received form from [employer] for incontinence supplies for Dr. Sharene Skeans to sign and complete. Contacted [employer] and let them know Dr. Sharene Skeans has retired and [employer] provider will be Dr. Devonne Doughty. They are sending a new form reflecting this change.

## 2021-09-15 ENCOUNTER — Ambulatory Visit (INDEPENDENT_AMBULATORY_CARE_PROVIDER_SITE_OTHER): Payer: Medicaid Other | Admitting: Allergy & Immunology

## 2021-09-15 ENCOUNTER — Other Ambulatory Visit: Payer: Self-pay

## 2021-09-15 ENCOUNTER — Encounter: Payer: Self-pay | Admitting: Allergy & Immunology

## 2021-09-15 VITALS — BP 126/82 | HR 107 | Temp 98.2°F | Resp 18 | Ht 64.5 in | Wt 237.2 lb

## 2021-09-15 DIAGNOSIS — J3089 Other allergic rhinitis: Secondary | ICD-10-CM | POA: Diagnosis not present

## 2021-09-15 DIAGNOSIS — J4550 Severe persistent asthma, uncomplicated: Secondary | ICD-10-CM | POA: Diagnosis not present

## 2021-09-15 DIAGNOSIS — J302 Other seasonal allergic rhinitis: Secondary | ICD-10-CM

## 2021-09-15 DIAGNOSIS — L2089 Other atopic dermatitis: Secondary | ICD-10-CM

## 2021-09-15 NOTE — Patient Instructions (Addendum)
1. Allergic rhinoconjunctivitis - Continue with the Singulair 10mg  daily.  - Continue with Xyzal (levocetirizine) 5mg  tablet once daily.  - Add on cromolyn eye drops one drop per eye up to four times daily (this is the only one that Medicaid covers right now).  2. Eczema - Restarted the Dupient today. - Start prednisone dose pack today.  - Continue with clobetasol shampoo twice every other week like you are doing. - Continue with Eucrisa twice daily on the face as needed. - Continue with triamcinolone ointment compounded with Eucerin twice daily for the rest of the body.   3. Severe persistent asthma, uncomplicated - Lung testing not done. - We will continue with Dupixent every two weeks.  - Daily controller medication(s): Symbicort 160/4.5 two puffs twice daily with spacer + Flovent two puffs twice daily  + Singulair 10mg  daily + Dupixent every 2 weeks - Rescue medications: ProAir 4 puffs every 4-6 hours as needed or albuterol nebulizer one vial puffs every 4-6 hours as needed - Asthma control goals:  * Full participation in all desired activities (may need albuterol before activity) * Albuterol use two time or less a week on average (not counting use with activity) * Cough interfering with sleep two time or less a month * Oral steroids no more than once a year * No hospitalizations  4. GERD - Continue with omeprazole daily.     5. Return in about 3 months (around 12/16/2021).    Please inform of any Emergency Department visits, hospitalizations, or changes in symptoms. Call before going to the ED for breathing or allergy symptoms since we might be able to fit you in for a sick visit. Feel free to contact 12/18/2021 anytime with any questions, problems, or concerns.  It was a pleasure to see you and your family again today!  Websites that have reliable patient information: 1. American Academy of Asthma, Allergy, and Immunology: www.aaaai.org 2. Food Allergy Research and  Education (FARE): foodallergy.org 3. Mothers of Asthmatics: http://www.asthmacommunitynetwork.org 4. American College of Allergy, Asthma, and Immunology: www.acaai.org   COVID-19 Vaccine Information can be found at: [place] For questions related to vaccine distribution or appointments, please email [email or web address] or call [phone].   We realize that you might be concerned about having an allergic reaction to the COVID19 vaccines. To help with that concern, WE ARE OFFERING THE COVID19 VACCINES IN OUR OFFICE! Ask the front desk for dates!     "Like" [place] on Facebook and Instagram for our latest updates!      A healthy democracy works best when [email or web address] participate! Make sure you are registered to vote! If you have moved or changed any of your contact information, you will need to get this updated before voting!  In some cases, you MAY be able to register to vote online: [phone]    EARLY VOTING HAS STARTED! If you still need to register to vote, you can do this and cast a ballot at any of the early voting locations!

## 2021-09-15 NOTE — Progress Notes (Signed)
FOLLOW UP  Date of Service/Encounter:  09/17/21   Assessment:   Eczema - better controlled on the Dupixent   Severe persistent asthma - doing very well on Fasenra every 8 weeks (prednisone x 1 in > 1 year), but now with atopic dermatitis and changing to Dupixent   Seasonal and perennial allergies rhinitis    Recent COVID positive testing (Fall 2020) - now fully immunized   Complicated history, including a prolonged hospitalization following respiratory arrest in the field (Oct 2018)   Multiple complications secondary to recurrent prednisone courses, including bone abnormalities   History of non-compliance - improved  Plan/Recommendations:   1. Allergic rhinoconjunctivitis - Continue with the Singulair 10mg  daily.  - Continue with Xyzal (levocetirizine) 5mg  tablet once daily.  - Add on cromolyn eye drops one drop per eye up to four times daily (this is the only one that Medicaid covers right now).  2. Eczema - Restarted the Dupient today. - Start prednisone dose pack today.  - Continue with clobetasol shampoo twice every other week like you are doing. - Continue with Eucrisa twice daily on the face as needed. - Continue with triamcinolone ointment compounded with Eucerin twice daily for the rest of the body.   3. Severe persistent asthma, uncomplicated - Lung testing not done. - We will continue with Dupixent every two weeks.  - Daily controller medication(s): Symbicort 160/4.5 two puffs twice daily with spacer + Flovent two puffs twice daily  + Singulair 10mg  daily + Dupixent every 2 weeks - Rescue medications: ProAir 4 puffs every 4-6 hours as needed or albuterol nebulizer one vial puffs every 4-6 hours as needed - Asthma control goals:  * Full participation in all desired activities (may need albuterol before activity) * Albuterol use two time or less a week on average (not counting use with activity) * Cough interfering with sleep two time or less a month *  Oral steroids no more than once a year * No hospitalizations  4. GERD - Continue with omeprazole daily.     5. Return in about 3 months (around 12/16/2021).    Subjective:   [name] is a 20 y.o. female presenting today for follow up of No chief complaint on file.   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  [name] is a 20 y.o. female presenting for a follow up visit. [name] is well-known to the practice.  [name] was last seen in March 2022.  At that time, her asthma was under fairly good control.  [name] was on Fasenra every 8 weeks as well as Symbicort 2 puffs twice daily, Flovent 2 puffs twice daily, and Singulair.  We decided  to change her to Dupixent due to coexisting atopic dermatitis.  We continue with her clobetasol shampoo as well as Eucrisa and triamcinolone as needed.  Allergic rhinitis was under good control with Singulair and Xyzal.  We added on an eyedrop.  Since last visit, [name] has done well.  Asthma/Respiratory Symptom History: [name] has not received any Dupixent in a couple of months and this is showing with her increase  in her symptoms.  [name] did use a pack of prednisone that we had given her back in April to have on hand.  [name] has not been to the hospital nor has [name] needed to go to the emergency room or urgent care.  [name] remains on her Symbicort and her Flovent, both twice daily.  [name] is also on the Singulair.  [name] has been coughing more than usual, but mom thinks this will improve once we get her back on the Dupixent.  Allergic Rhinitis Symptom History: [name] has not needed antibiotics for any sinusitis or acute otitis media.  [name] remains on her Singulair as well as her Xyzal .  [name] has never been a fan of no sprays.  [name] has never been on allergy shots.  Skin Symptom History: Her skin is not under great control now.  While on the Dupixent, her symptoms were fairly well controlled.  [name] has been developing new eczematous patches behind her ears as well as on the back of her neck.  [name] has not seen dermatology in quite some time.  [name] is not in school.  They have been taking a break to get her rehab back up to snuff.  [name] remains in physical therapy.  [name] is able to walk independently without the walker when [name] is at home, although [name] goes from 1 piece of furniture to the other.  They are working on getting her approved for a scooter so that her mobility and independence can be increased.  Otherwise, there have been no changes to her past medical history, surgical history, family history, or social history.    Review of Systems  Constitutional: Negative.  Negative for chills, fever, malaise/fatigue and weight loss.  HENT: Negative.  Negative for congestion, ear discharge and ear pain.   Eyes:  Negative for pain, discharge and redness.  Respiratory:  Negative for cough, sputum production, shortness of breath and wheezing.   Cardiovascular: Negative.  Negative for chest pain and palpitations.  Gastrointestinal:  Negative for abdominal pain, constipation, diarrhea, heartburn, nausea and vomiting.  Skin:  Positive  for itching and rash.  Neurological:  Negative for dizziness and headaches.  Endo/Heme/Allergies:  Negative for environmental allergies. Does not bruise/bleed easily.      Objective:   Blood pressure 126/82, pulse (!) 107, temperature 98.2 F (36.8 C), temperature source Temporal, resp. rate 18, height 5' 4.5" (1.638 m), weight 237 lb 4 oz (107.6 kg), SpO2 95 %. Body mass index is 40.09 kg/m.   Physical Exam:  Physical Exam Vitals reviewed.  Constitutional:      Appearance: [name] is well-developed.  HENT:     Head: Normocephalic and atraumatic.     Right Ear: Tympanic membrane, ear canal and external ear normal.     Left Ear: Tympanic membrane, ear canal and external ear normal.     Nose: No nasal deformity, septal deviation, mucosal edema or rhinorrhea.     Right Turbinates: Enlarged, swollen and pale.     Left Turbinates: Enlarged, swollen and pale.     Right Sinus: No  maxillary sinus tenderness or frontal sinus tenderness.     Left Sinus: No maxillary sinus tenderness or frontal sinus tenderness.     Mouth/Throat:     Mouth: Mucous membranes are not pale and not dry.     Pharynx: Uvula midline.  Eyes:     General: Lids are normal. No allergic shiner.       Right eye: No discharge.        Left eye: No discharge.     Conjunctiva/sclera: Conjunctivae normal.     Right eye: Right conjunctiva is not injected. No chemosis.    Left eye: Left conjunctiva is not injected. No chemosis.    Pupils: Pupils are equal, round, and reactive to light.  Cardiovascular:     Rate and Rhythm: Normal rate and regular rhythm.     Heart sounds: Normal heart sounds.  Pulmonary:     Effort: Pulmonary effort is normal. No tachypnea, accessory muscle usage or respiratory distress.     Breath sounds: Normal breath sounds. No wheezing, rhonchi or rales.     Comments: Moving air well in all lung fields.  There might be some slight expiratory wheezing at the bases. Chest:     Chest wall: No  tenderness.  Lymphadenopathy:     Cervical: No cervical adenopathy.  Skin:    General: Skin is warm.     Capillary Refill: Capillary refill takes less than 2 seconds.     Coloration: Skin is not pale.     Findings: Rash present. No abrasion, erythema or petechiae. Rash is not papular, urticarial or vesicular.     Comments: [name] does have some eczematous patches behind her ears as well as behind her neck.  Overall, her face is fairly dry. No crusting or oozing noted.  Neurological:     Mental Status: [name] is alert.  Psychiatric:        Behavior: Behavior is cooperative.     Diagnostic studies: none       Malachi Bonds, MD  [facility]

## 2021-09-17 ENCOUNTER — Encounter: Payer: Self-pay | Admitting: Allergy & Immunology

## 2021-09-29 ENCOUNTER — Ambulatory Visit: Payer: Medicaid Other

## 2021-10-06 ENCOUNTER — Other Ambulatory Visit: Payer: Self-pay | Admitting: *Deleted

## 2021-10-06 MED ORDER — FLOVENT HFA 110 MCG/ACT IN AERO: INHALATION_SPRAY | RESPIRATORY_TRACT | 5 refills | Status: DC

## 2021-10-06 MED ORDER — EUCRISA 2 % EX OINT: TOPICAL_OINTMENT | CUTANEOUS | 5 refills | Status: DC

## 2021-10-06 MED ORDER — MONTELUKAST SODIUM 10 MG PO TABS: 10.0000 mg | ORAL_TABLET | Freq: Every day | ORAL | 5 refills | Status: DC

## 2021-10-20 ENCOUNTER — Telehealth: Payer: Self-pay | Admitting: Allergy & Immunology

## 2021-10-20 NOTE — Telephone Encounter (Signed)
Lm for pts parent to call us back I do not see her on a daily prednisone so need to know for what reason pt needs prednisone

## 2021-10-20 NOTE — Telephone Encounter (Signed)
Patients mother called back and stated that she started having an eczema flare and wheezing yesterday and is requesting prednisone for her. She has missed her Dupixent injection by over 3 weeks and is scheduled to come in Friday for her injection.

## 2021-10-20 NOTE — Telephone Encounter (Signed)
Patient mom called and needs to have prednisone called into [facility] .[phone].

## 2021-10-21 MED ORDER — PREDNISONE 10 MG PO TABS: ORAL_TABLET | ORAL | 0 refills | Status: DC

## 2021-10-21 NOTE — Telephone Encounter (Signed)
Called and informed patients mother. Patients mother verbalized understanding.  

## 2021-10-21 NOTE — Addendum Note (Signed)
Addended by: Alfonse Spruce on: 10/21/2021 08:23 AM   Modules accepted: Orders

## 2021-10-21 NOTE — Telephone Encounter (Signed)
Sent in prednisone. Please inform Mom.   Malachi Bonds, MD [facility]

## 2021-10-23 ENCOUNTER — Ambulatory Visit: Payer: Medicaid Other

## 2021-11-11 ENCOUNTER — Other Ambulatory Visit (INDEPENDENT_AMBULATORY_CARE_PROVIDER_SITE_OTHER): Payer: Self-pay

## 2021-11-11 DIAGNOSIS — Z72821 Inadequate sleep hygiene: Secondary | ICD-10-CM

## 2021-11-11 DIAGNOSIS — M62830 Muscle spasm of back: Secondary | ICD-10-CM

## 2021-11-11 MED ORDER — GABAPENTIN 300 MG PO CAPS: 300.0000 mg | ORAL_CAPSULE | Freq: Three times a day (TID) | ORAL | 5 refills | Status: DC

## 2021-11-11 MED ORDER — TRAZODONE HCL 50 MG PO TABS: ORAL_TABLET | ORAL | 5 refills | Status: DC

## 2021-11-29 ENCOUNTER — Telehealth: Payer: Self-pay | Admitting: Allergy

## 2021-11-29 MED ORDER — PREDNISONE 10 MG PO TABS: ORAL_TABLET | ORAL | 0 refills | Status: DC

## 2021-11-29 NOTE — Telephone Encounter (Signed)
Please call patient and have them see Dr. Dellis Anes this week for follow up asthma exacerbation.  Thank you.   Patient's mother called and left vmail regarding her daughter. Tried to call back but went straight to vmail. Left message to call back.  Patient states that she has a chest cold and has been wheezing for 1 day.  She usually takes Flovent, ventolin, montelukast.   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   Advised mom to continue with all her inhalers and mucinex. If she has to use albuter neb back to back within 15-30 min with no improvement go to the ER/UC.  Prednisone taper sent in.  Take prednisone 40mg  daily x 2 days, 30mg  daily x 2 days, 20mg  daily x 2 days and 10mg  daily x 2 days.

## 2021-11-30 NOTE — Telephone Encounter (Signed)
Called and left a voicemail asking for parent to return call to get her scheduled to see Dr. Dellis Anes this week to follow up.

## 2021-12-01 NOTE — Telephone Encounter (Signed)
I think she has missed her biologic appointments, if I am not mistaken. Glad that she is coming in.  Malachi Bonds, MD [facility]

## 2021-12-03 ENCOUNTER — Ambulatory Visit (INDEPENDENT_AMBULATORY_CARE_PROVIDER_SITE_OTHER): Payer: Medicaid Other | Admitting: Allergy & Immunology

## 2021-12-03 ENCOUNTER — Encounter: Payer: Self-pay | Admitting: Allergy & Immunology

## 2021-12-03 ENCOUNTER — Other Ambulatory Visit: Payer: Self-pay

## 2021-12-03 VITALS — BP 118/82 | HR 121 | Temp 97.6°F | Resp 16 | Ht 64.0 in | Wt 251.0 lb

## 2021-12-03 DIAGNOSIS — L2089 Other atopic dermatitis: Secondary | ICD-10-CM | POA: Diagnosis not present

## 2021-12-03 DIAGNOSIS — J4550 Severe persistent asthma, uncomplicated: Secondary | ICD-10-CM

## 2021-12-03 DIAGNOSIS — J302 Other seasonal allergic rhinitis: Secondary | ICD-10-CM

## 2021-12-03 DIAGNOSIS — J3089 Other allergic rhinitis: Secondary | ICD-10-CM | POA: Diagnosis not present

## 2021-12-03 MED ORDER — ALBUTEROL SULFATE (2.5 MG/3ML) 0.083% IN NEBU: 2.5000 mg | INHALATION_SOLUTION | RESPIRATORY_TRACT | 1 refills | Status: DC | PRN

## 2021-12-03 MED ORDER — MONTELUKAST SODIUM 10 MG PO TABS
10.0000 mg | ORAL_TABLET | Freq: Every day | ORAL | 5 refills | Status: DC
Start: 2021-12-03 — End: 2022-04-01

## 2021-12-03 MED ORDER — FLOVENT HFA 110 MCG/ACT IN AERO: INHALATION_SPRAY | RESPIRATORY_TRACT | 5 refills | Status: DC

## 2021-12-03 MED ORDER — ALBUTEROL SULFATE HFA 108 (90 BASE) MCG/ACT IN AERS: 2.0000 | INHALATION_SPRAY | RESPIRATORY_TRACT | 1 refills | Status: DC | PRN

## 2021-12-03 MED ORDER — BUDESONIDE-FORMOTEROL FUMARATE 160-4.5 MCG/ACT IN AERO
2.0000 | INHALATION_SPRAY | Freq: Two times a day (BID) | RESPIRATORY_TRACT | 5 refills | Status: DC
Start: 2021-12-03 — End: 2022-04-01

## 2021-12-03 MED ORDER — IPRATROPIUM BROMIDE 0.02 % IN SOLN: 0.5000 mg | Freq: Four times a day (QID) | RESPIRATORY_TRACT | 1 refills | Status: AC | PRN

## 2021-12-03 NOTE — Patient Instructions (Addendum)
1. Allergic rhinoconjunctivitis - Continue with the Singulair 10mg  daily.  - Continue with Xyzal (levocetirizine) 5mg  tablet once daily.   2. Eczema - Restarted the Dupient today. - Continue with clobetasol shampoo twice every other week like you are doing. - Continue with Eucrisa twice daily on the face as needed. - Continue with triamcinolone ointment compounded with Eucerin twice daily for the rest of the body.   3. Severe persistent asthma, uncomplicated - Lung testing not done. - Complete the prednisone pack provided by Dr. . - We restart the Dupixent today (make an appointment in two weeks for another injection). - Daily controller medication(s): Symbicort 160/4.5 two puffs twice daily with spacer + Flovent two puffs twice daily  + Singulair 10mg  daily + Dupixent every 2 weeks - Rescue medications: ProAir 4 puffs every 4-6 hours as needed or albuterol nebulizer one vial puffs every 4-6 hours as needed - Asthma control goals:  * Full participation in all desired activities (may need albuterol before activity) * Albuterol use two time or less a week on average (not counting use with activity) * Cough interfering with sleep two time or less a month * Oral steroids no more than once a year * No hospitalizations  4. GERD - Continue with omeprazole daily.     5. Return in about 2 months (around 01/31/2022).    Please inform of any Emergency Department visits, hospitalizations, or changes in symptoms. Call before going to the ED for breathing or allergy symptoms since we might be able to fit you in for a sick visit. Feel free to contact 04/02/2022 anytime with any questions, problems, or concerns.  It was a pleasure to see you and your family again today!  Websites that have reliable patient information: 1. American Academy of Asthma, Allergy, and Immunology: www.aaaai.org 2. Food Allergy Research and Education (FARE): foodallergy.org 3. Mothers of Asthmatics:  http://www.asthmacommunitynetwork.org 4. American College of Allergy, Asthma, and Immunology: www.acaai.org   COVID-19 Vaccine Information can be found at: [place] For questions related to vaccine distribution or appointments, please email [email or web address] or call [phone].   We realize that you might be concerned about having an allergic reaction to the COVID19 vaccines. To help with that concern, WE ARE OFFERING THE COVID19 VACCINES IN OUR OFFICE! Ask the front desk for dates!     Like [place] on [email or web address] and Instagram for our latest updates!      A healthy democracy works best when [phone] participate! Make sure you are registered to vote! If you have moved or changed any of your contact information, you will need to get this updated before voting!  In some cases, you MAY be able to register to vote online: [place]

## 2021-12-03 NOTE — Progress Notes (Signed)
FOLLOW UP  Date of Service/Encounter:  12/03/21   Assessment:   Eczema - better controlled on the Dupixent   Severe persistent asthma - doing very well on Fasenra every 8 weeks (prednisone x 1 in > 1 year), but now with atopic dermatitis and changing to Dupixent   Seasonal and perennial allergies rhinitis    Recent COVID positive testing (Fall 2020) - now fully immunized   Complicated history, including a prolonged hospitalization following respiratory arrest in the field (Oct 2018)   Multiple complications secondary to recurrent prednisone courses, including bone abnormalities   History of non-compliance - improved  Plan/Recommendations:   1. Allergic rhinoconjunctivitis - Continue with the Singulair 10mg  daily.  - Continue with Xyzal (levocetirizine) 5mg  tablet once daily.   2. Eczema - Restarted the Dupient today. - Continue with clobetasol shampoo twice every other week like you are doing. - Continue with Eucrisa twice daily on the face as needed. - Continue with triamcinolone ointment compounded with Eucerin twice daily for the rest of the body.   3. Severe persistent asthma, uncomplicated - Lung testing not done. - Complete the prednisone pack provided by Dr. Maudie Mercury. - We restart the Dupixent today (make an appointment in two weeks for another injection). - Daily controller medication(s): Symbicort 160/4.5 two puffs twice daily with spacer + Flovent 129mcg two puffs twice daily  + Singulair 10mg  daily + Dupixent every 2 weeks - Rescue medications: ProAir 4 puffs every 4-6 hours as needed or albuterol nebulizer one vial puffs every 4-6 hours as needed - Asthma control goals:  * Full participation in all desired activities (may need albuterol before activity) * Albuterol use two time or less a week on average (not counting use with activity) * Cough interfering with sleep two time or less a month * Oral steroids no more than once a year * No hospitalizations  4.  GERD - Continue with omeprazole daily.     5. Return in about 2 months (around 01/31/2022).   Subjective:   [name] is a 21 y.o. 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   [name] is a 21 y.o. female presenting for a follow up visit. She was last seen in October 2022.  At that visit, we continued with Dupixent every 2 weeks as well as Symbicort and Flovent 2 puffs twice daily.  She was also on Singulair 10 mg daily.  For her allergic rhinitis, would continue with Singulair as well  as Xyzal.  We added on cromolyn eyedrops 1 drop per eye up to 4 times daily.  Her eczema was under poor control because she had been off of the [place] for several weeks.  We started her on a prednisone Dosepak and continue clobetasol as well as Eucrisa and triamcinolone.  GERD was controlled with omeprazole.  Since last visit, she has done well.  However, over the weekend, she did call Dr. Maudie Mercury on  Sunday. She got prednisone and her symptoms have improved. We had previously given her some prednisone in late November 2022 as well.   She has been sleeping a lot and coughing up gunk. Symptoms started on January 5th or 6th. It has gradually worsened. Her last Dupixent was in September 2022. She has been sick on and off since that time. She had a sinus infection at one point and then something else after that.   Asthma/Respiratory Symptom History: She has been using the Flovent and the Symbicort 2 puffs of each twice daily.  She did go through some albuterol nebs during her recent illness, but otherwise has been doing fairly well.  She has not needed prednisone aside from November and earlier this weekend.  Before that, it has been several months. She has had fairly good stamina aside from the last 4 to 6 weeks.  Allergic Rhinitis Symptom History: She continues on Singulair as well as Xyzal.  She has not needed any antibiotics.  She does have an eyedrop to use as needed.  Overall, symptoms are well controlled.  Skin Symptom History: Her skin was under better control with the [place].  She does need a refill of the triamcinolone, but she has not filled the clobetasol.  She would like to restart the Dupixent.  Again, the last dose was in September 2022.  Otherwise, there have been no changes to her past medical history, surgical history, family history, or social history.    Review of Systems  Constitutional: Negative.  Negative for chills, fever, malaise/fatigue and weight loss.  HENT:  Positive for congestion. Negative for ear discharge and ear pain.   Eyes:  Negative for pain, discharge and redness.  Respiratory:  Positive for cough, shortness of breath and wheezing. Negative for sputum production.   Cardiovascular: Negative.  Negative for chest pain and palpitations.  Gastrointestinal:  Negative for abdominal pain, constipation, diarrhea, heartburn, nausea and vomiting.  Skin: Negative.   Negative for itching and rash.  Neurological:  Negative for dizziness and headaches.  Endo/Heme/Allergies:  Negative for environmental allergies. Does not bruise/bleed easily.      Objective:   Blood pressure 118/82, pulse (!) 121, temperature 97.6 F (36.4 C), resp. rate 16, height 5\' 4"  (1.626 m), weight 251 lb (113.9 kg), SpO2 95 %. Body mass index is 43.08 kg/m.   Physical Exam:  Physical Exam Vitals reviewed.  Constitutional:      Appearance: She is well-developed.  HENT:     Head: Normocephalic and atraumatic.     Right Ear: Tympanic membrane, ear canal and external ear normal.     Left Ear: Tympanic membrane, ear canal and external ear normal.     Nose: No nasal deformity, septal deviation, mucosal edema or rhinorrhea.     Right Turbinates: Enlarged, swollen and pale.     Left Turbinates: Enlarged, swollen and pale.     Right Sinus: No maxillary sinus tenderness or frontal sinus tenderness.     Left Sinus: No maxillary sinus tenderness or frontal sinus tenderness.  Mouth/Throat:     Mouth: Mucous membranes are not pale and not dry.     Pharynx: Uvula midline.  Eyes:     General: Lids are normal. No allergic shiner.       Right eye: No discharge.        Left eye: No discharge.     Conjunctiva/sclera: Conjunctivae normal.     Right eye: Right conjunctiva is not injected. No chemosis.    Left eye: Left conjunctiva is not injected. No chemosis.    Pupils: Pupils are equal, round, and reactive to light.  Cardiovascular:     Rate and Rhythm: Normal rate and regular rhythm.     Heart sounds: Normal heart sounds.  Pulmonary:     Effort: Pulmonary effort is normal. No tachypnea, accessory muscle usage or respiratory distress.     Breath sounds: Normal breath sounds. No wheezing, rhonchi or rales.     Comments: Moving air well in all lung fields.  There some slight expiratory wheezing at the bases. Chest:     Chest wall: No tenderness.  Lymphadenopathy:     Cervical:  No cervical adenopathy.  Skin:    General: Skin is warm.     Capillary Refill: Capillary refill takes less than 2 seconds.     Coloration: Skin is not pale.     Findings: Rash present. No abrasion, erythema or petechiae. Rash is not papular, urticarial or vesicular.     Comments: She does have some eczematous patches behind her ears as well as behind her neck.  Overall, her face is fairly dry. No crusting or oozing noted. She also has some eczematous lesions on the top of her back with some excoriations.   Neurological:     Mental Status: She is alert.  Psychiatric:        Behavior: Behavior is cooperative.     Diagnostic studies:    Spirometry: results abnormal (FEV1: 2.25/68%, FVC: 3.10/82%, FEV1/FVC: 73%).    Spirometry consistent with moderate obstructive disease.   Allergy Studies: none        Salvatore Marvel, MD  [facility]

## 2021-12-04 ENCOUNTER — Emergency Department (HOSPITAL_COMMUNITY)
Admission: EM | Admit: 2021-12-04 | Discharge: 2021-12-04 | Disposition: A | Discharge status: Home / Self Care | Payer: Medicaid Other | Attending: Emergency Medicine | Admitting: Emergency Medicine

## 2021-12-04 ENCOUNTER — Emergency Department (HOSPITAL_COMMUNITY): Payer: Medicaid Other

## 2021-12-04 ENCOUNTER — Encounter (HOSPITAL_COMMUNITY): Payer: Self-pay | Admitting: *Deleted

## 2021-12-04 DIAGNOSIS — R590 Localized enlarged lymph nodes: Secondary | ICD-10-CM | POA: Insufficient documentation

## 2021-12-04 DIAGNOSIS — Z79899 Other long term (current) drug therapy: Secondary | ICD-10-CM | POA: Diagnosis not present

## 2021-12-04 DIAGNOSIS — Z7984 Long term (current) use of oral hypoglycemic drugs: Secondary | ICD-10-CM | POA: Insufficient documentation

## 2021-12-04 DIAGNOSIS — D72829 Elevated white blood cell count, unspecified: Secondary | ICD-10-CM | POA: Diagnosis not present

## 2021-12-04 DIAGNOSIS — R509 Fever, unspecified: Secondary | ICD-10-CM | POA: Diagnosis present

## 2021-12-04 DIAGNOSIS — Z7951 Long term (current) use of inhaled steroids: Secondary | ICD-10-CM | POA: Diagnosis not present

## 2021-12-04 DIAGNOSIS — J45909 Unspecified asthma, uncomplicated: Secondary | ICD-10-CM | POA: Diagnosis not present

## 2021-12-04 DIAGNOSIS — K047 Periapical abscess without sinus: Secondary | ICD-10-CM | POA: Insufficient documentation

## 2021-12-04 LAB — BASIC METABOLIC PANEL
Anion gap: 7 (ref 5–15)
BUN: 8 mg/dL (ref 6–20)
CO2: 25 mmol/L (ref 22–32)
Calcium: 8.8 mg/dL — ABNORMAL LOW (ref 8.9–10.3)
Chloride: 101 mmol/L (ref 98–111)
Creatinine, Ser: 0.95 mg/dL (ref 0.44–1.00)
GFR, Estimated: >60 mL/min (ref >60)
Glucose, Bld: 111 mg/dL — ABNORMAL HIGH (ref 70–99)
Potassium: 4.1 mmol/L (ref 3.5–5.1)
Sodium: 133 mmol/L — ABNORMAL LOW (ref 135–145)

## 2021-12-04 LAB — CBC WITH DIFFERENTIAL/PLATELET
Abs Immature Granulocytes: 0.16 10*3/uL — ABNORMAL HIGH (ref 0.00–0.07)
Basophils Absolute: 0.1 10*3/uL (ref 0.0–0.1)
Basophils Relative: 1 %
Eosinophils Absolute: 0.8 10*3/uL — ABNORMAL HIGH (ref 0.0–0.5)
Eosinophils Relative: 7 %
HCT: 44.6 % (ref 36.0–46.0)
Hemoglobin: 14.5 g/dL (ref 12.0–15.0)
Immature Granulocytes: 1 %
Lymphocytes Relative: 13 %
Lymphs Abs: 1.6 10*3/uL (ref 0.7–4.0)
MCH: 28.2 pg (ref 26.0–34.0)
MCHC: 32.5 g/dL (ref 30.0–36.0)
MCV: 86.8 fL (ref 80.0–100.0)
Monocytes Absolute: 0.7 10*3/uL (ref 0.1–1.0)
Monocytes Relative: 6 %
Neutro Abs: 9 10*3/uL — ABNORMAL HIGH (ref 1.7–7.7)
Neutrophils Relative %: 72 %
Platelets: 325 10*3/uL (ref 150–400)
RBC: 5.14 MIL/uL — ABNORMAL HIGH (ref 3.87–5.11)
RDW: 12.7 % (ref 11.5–15.5)
WBC: 12.4 10*3/uL — ABNORMAL HIGH (ref 4.0–10.5)
nRBC: 0 % (ref 0.0–0.2)

## 2021-12-04 LAB — I-STAT BETA HCG BLOOD, ED (MC, WL, AP ONLY): I-stat hCG, quantitative: <5 m[IU]/mL (ref <5)

## 2021-12-04 MED ORDER — OXYCODONE-ACETAMINOPHEN 5-325 MG PO TABS
1.0000 | ORAL_TABLET | Freq: Once | ORAL | Status: AC
  Administered 2021-12-04: 1 via ORAL
  Filled 2021-12-04: qty 1

## 2021-12-04 MED ORDER — ACETAMINOPHEN 500 MG PO TABS
500.0000 mg | ORAL_TABLET | Freq: Once | ORAL | Status: AC
  Administered 2021-12-04: 500 mg via ORAL
  Filled 2021-12-04: qty 1

## 2021-12-04 MED ORDER — IOHEXOL 350 MG/ML SOLN
75.0000 mL | Freq: Once | INTRAVENOUS | Status: AC | PRN
  Administered 2021-12-04: 75 mL via INTRAVENOUS

## 2021-12-04 MED ORDER — OXYCODONE-ACETAMINOPHEN 5-325 MG PO TABS: 1.0000 | ORAL_TABLET | Freq: Four times a day (QID) | ORAL | 0 refills | Status: DC | PRN

## 2021-12-04 NOTE — ED Provider Notes (Signed)
[place] EMERGENCY DEPARTMENT Provider Note   CSN: [id number] Arrival date & time: 12/04/21  1402     History  Chief Complaint  Patient presents with   Dental Problem   Oral Swelling    [name] is a 21 y.o. female presenting for evaluation of fever, dental pain, and facial swelling.  History provided mostly by mom, as patient has anoxic brain injury.  Per mom, patient has had dental pain and facial swelling.  Seen by urgent care, prescribed penicillin, but recommended they come to the ER for further evaluation.  Patient has not yet started the penicillin.  She also has a significant history of asthma, is finishing a steroid taper.  No change in respiratory status.  Patient has not had anything for pain.  Has a dental appointment scheduled on Monday.   HPI     Home Medications Prior to Admission medications   Medication Sig Start Date End Date Taking? Authorizing Provider  oxyCODONE-acetaminophen (PERCOCET/ROXICET) 5-325 MG tablet Take 1 tablet by mouth every 6 (six) hours as needed for severe pain. 12/04/21  Yes Kellyann Ordway, PA-C  albuterol (PROVENTIL) (2.5 MG/3ML) 0.083% nebulizer solution Take 3 mLs (2.5 mg total) by nebulization every 4 (four) hours as needed for wheezing or shortness of breath. 12/03/21   Valentina Shaggy, MD  albuterol (VENTOLIN HFA) 108 (90 Base) MCG/ACT inhaler Inhale 2 puffs into the lungs every 4 (four) hours as needed for wheezing or shortness of breath. 12/03/21   Valentina Shaggy, MD  amantadine (SYMMETREL) 100 MG capsule TAKE 1-1/2 (capsules non breakable give 1 or 2) IN THE MORNING AND 1 capsule AT LUNCHTIME 08/05/21   Jodi Geralds, MD  baclofen (LIORESAL) 20 MG tablet Take 1 tablet (20 mg total) by mouth 3 (three) times daily. 08/05/21   Jodi Geralds, MD  budesonide-formoterol (SYMBICORT) 160-4.5 MCG/ACT inhaler Inhale 2 puffs into the lungs 2 (two) times daily. 12/03/21   Valentina Shaggy,  MD  cetirizine (ZYRTEC) 10 MG tablet Take 10 mg by mouth daily.     [provider]  clobetasol ointment (TEMOVATE) [id number] % Apply 1 application topically 2 (two) times daily. Patient taking differently: Apply 1 application topically 2 (two) times daily as needed (skin care). 12/12/18   Valentina Shaggy, MD  Clobetasol Propionate 0.05 % shampoo Apply 1 application topically 2 (two) times daily. Patient taking differently: Apply 1 application topically 2 (two) times daily as needed (skin care). 12/12/18   Valentina Shaggy, MD  Clobetasol Propionate 0.05 % shampoo Shampoo twice daily for two weeks 12/11/19   Valentina Shaggy, MD  CLOBEX 0.05 % shampoo Apply 1 application topically 2 (two) times daily. 12/13/19   Valentina Shaggy, MD  clonazePAM (KLONOPIN) 0.5 MG tablet Take 2 tablets (1 mg total) by mouth 2 (two) times daily. 08/05/21   Jodi Geralds, MD  cloNIDine (CATAPRES) 0.1 MG tablet TAKE ONE TABLET BY MOUTH TWICE A DAY *MAKE APPT FOR FURTHER REFILLS* 08/11/20   Jodi Geralds, MD  DUPIXENT 300 MG/2ML prefilled syringe Inject 300 mg into the skin every 14 (fourteen) days. 11/02/21   [provider]  EPINEPHrine 0.3 mg/0.3 mL IJ SOAJ injection Inject 0.3 mLs (0.3 mg total) into the muscle once as needed for anaphylaxis. 12/13/19   Valentina Shaggy, MD  EUCRISA 2 % OINT APPLY ONE APPLICATION TOPICALLY TWICE A DAY (TO THE FACE) 10/06/21   Valentina Shaggy, MD  FLOVENT HFA 110  MCG/ACT inhaler INHALE TWO PUFFS BY MOUTH INTO THE LUNGS TWICE A DAY 12/03/21   Valentina Shaggy, MD  gabapentin (NEURONTIN) 300 MG capsule Take 1 capsule (300 mg total) by mouth 3 (three) times daily. 11/11/21   Teressa Lower, MD  ipratropium (ATROVENT) 0.02 % nebulizer solution Inhale 2.5 mLs (0.5 mg total) into the lungs every 6 (six) hours as needed for wheezing or shortness of breath. 12/03/21   Valentina Shaggy, MD  ketoconazole (NIZORAL) 2 % shampoo Shampoo  every 3-4 days for 8 weeks, then use prn Patient taking differently: Apply 1 application topically See admin instructions. Twice weekly as needed for scalp care. 04/24/19   Valentina Shaggy, MD  levETIRAcetam (KEPPRA) 500 MG tablet Take 1 tablet (500 mg total) by mouth 2 (two) times daily. 08/05/21   Jodi Geralds, MD  levocetirizine (XYZAL) 5 MG tablet Take 1 tablet (5 mg total) by mouth every evening. 02/18/21   Valentina Shaggy, MD  metFORMIN (GLUCOPHAGE) 500 MG tablet TAKE 1 TABLET (500 MG TOTAL) BY MOUTH DAILY. 11/19/19   Jodi Geralds, MD  methocarbamol (ROBAXIN) 500 MG tablet Take 1 tablet (500 mg total) by mouth once as needed for up to 2 doses for muscle spasms. 07/14/19   Soufleris, Lelon Frohlich, MD  montelukast (SINGULAIR) 10 MG tablet Take 1 tablet (10 mg total) by mouth at bedtime. 12/03/21   Valentina Shaggy, MD  omeprazole (PRILOSEC) 20 MG capsule Take 1 capsule (20 mg total) by mouth daily. 08/26/20   Jodi Geralds, MD  predniSONE (DELTASONE) 10 MG tablet Take prednisone 40mg  daily x 2 days, 30mg  daily x 2 days, 20mg  daily x 2 days and 10mg  daily x 2 days. 11/29/21   Garnet Sierras, DO  traZODone (DESYREL) 50 MG tablet TAKE TWO TABLETS BY MOUTH BEFORE BEDTIME 11/11/21   Teressa Lower, MD  triamcinolone ointment (KENALOG) 0.1 % Apply topically 2 (two) times daily. 02/18/21   Valentina Shaggy, MD      Allergies    Azithromycin, Biaxin [clarithromycin], Erythromycin, Macrolides and ketolides, Nitrofurantoin monohyd macro, Quinolones, Troleandomycin, and Mango flavor    Review of Systems   Review of Systems  Constitutional:  Positive for fever.  HENT:  Positive for dental problem and facial swelling.   Respiratory:  Positive for wheezing (chronic).   All other systems reviewed and are negative.  Physical Exam Updated Vital Signs BP (!) 129/93 (BP Location: Left Arm)    Pulse 92    Temp 99.1 F (37.3 C) (Oral)    Resp 20    SpO2 99%  Physical Exam Vitals  and nursing note reviewed.  Constitutional:      General: She is not in acute distress.    Appearance: Normal appearance.     Comments: nontoxic  HENT:     Head: Normocephalic and atraumatic.     Comments: Mild left-sided facial swelling. Ttp of L upper tooth. Handling secretions easily.  Eyes:     Conjunctiva/sclera: Conjunctivae normal.     Pupils: Pupils are equal, round, and reactive to light.  Cardiovascular:     Rate and Rhythm: Normal rate and regular rhythm.     Pulses: Normal pulses.  Pulmonary:     Effort: Pulmonary effort is normal. No respiratory distress.     Breath sounds: Wheezing present.     Comments: Speaking in full sentences.  Expiratory wheezing Abdominal:     General: There is no distension.     Palpations:  Abdomen is soft.     Tenderness: There is no abdominal tenderness.  Musculoskeletal:        General: Normal range of motion.     Cervical back: Normal range of motion and neck supple.  Skin:    General: Skin is warm and dry.     Capillary Refill: Capillary refill takes less than 2 seconds.  Neurological:     Mental Status: She is oriented to person, place, and time. Mental status is at baseline.  Psychiatric:        Mood and Affect: Mood and affect normal.        Behavior: Behavior normal.    ED Results / Procedures / Treatments   Labs (all labs ordered are listed, but only abnormal results are displayed) Labs Reviewed  CBC WITH DIFFERENTIAL/PLATELET - Abnormal; Notable for the following components:      Result Value   WBC 12.4 (*)    RBC 5.14 (*)    Neutro Abs 9.0 (*)    Eosinophils Absolute 0.8 (*)    Abs Immature Granulocytes 0.16 (*)    All other components within normal limits  BASIC METABOLIC PANEL - Abnormal; Notable for the following components:   Sodium 133 (*)    Glucose, Bld 111 (*)    Calcium 8.8 (*)    All other components within normal limits  RESP PANEL BY RT-PCR (FLU A&B, COVID) ARPGX2  I-STAT BETA HCG BLOOD, ED ([facility], [facility], [facility]  ONLY)    EKG None  Radiology CT Soft Tissue Neck W Contrast  Result Date: 12/04/2021 CLINICAL DATA:  Left facial swelling after tooth fracture EXAM: CT NECK WITH CONTRAST TECHNIQUE: Multidetector CT imaging of the neck was performed using the standard protocol following the bolus administration of intravenous contrast. RADIATION DOSE REDUCTION: This exam was performed according to the departmental dose-optimization program which includes automated exposure control, adjustment of the mA and/or kV according to patient size and/or use of iterative reconstruction technique. CONTRAST:  81mL OMNIPAQUE IOHEXOL 350 MG/ML SOLN COMPARISON:  None. FINDINGS: PHARYNX AND LARYNX: The nasopharynx, oropharynx and larynx are normal. Visible portions of the tongue base and floor of mouth are normal. Normal epiglottis, vallecula and pyriform sinuses. The larynx is normal. No retropharyngeal abscess, effusion or lymphadenopathy. SALIVARY GLANDS: Normal parotid, submandibular and sublingual glands. THYROID: Normal. LYMPH NODES: Left-greater-than-right subcentimeter submandibular lymph nodes. No enlarged or abnormal density nodes. VASCULAR: Major cervical vessels are patent. LIMITED INTRACRANIAL: Normal. VISUALIZED ORBITS: Normal. MASTOIDS AND VISUALIZED PARANASAL SINUSES: No fluid levels or advanced mucosal thickening. No mastoid effusion. SKELETON: No bony spinal canal stenosis. No lytic or blastic lesions. UPPER CHEST: Clear. OTHER: Mild inflammatory induration of the subcutaneous fat of the left face. No fluid collection or abscess. IMPRESSION: 1. Mild inflammatory induration of the subcutaneous fat of the left face. No fluid collection or abscess. 2. Left-greater-than-right subcentimeter submandibular lymph nodes, likely reactive. Electronically Signed   By: Ulyses Jarred M.D.   On: 12/04/2021 19:49    Procedures Procedures    Medications Ordered in ED Medications  oxyCODONE-acetaminophen (PERCOCET/ROXICET) 5-325 MG  per tablet 1 tablet (1 tablet Oral Given 12/04/21 1501)  acetaminophen (TYLENOL) tablet 500 mg (500 mg Oral Given 12/04/21 1503)  iohexol (OMNIPAQUE) 350 MG/ML injection 75 mL (75 mLs Intravenous Contrast Given 12/04/21 1924)  oxyCODONE-acetaminophen (PERCOCET/ROXICET) 5-325 MG per tablet 1 tablet (1 tablet Oral Given 12/04/21 2302)    ED Course/ Medical Decision Making/ A&P  Medical Decision Making   This patient presents to the ED for concern of fever, facial swelling, dental pain.  This involves a number of treatment options, and is a complaint that carries with it a moderate risk of complications and morbidity.  The differential diagnosis includes dental abscess, dental infection, facial cellulitis, peritonsillar abscess.   Co morbidities: Anoxic brain injury   Additional history: Reviewed recent pulmonology notes, patient's respiratory status has been at baseline, she is finishing a course of prednisone.  Lab Tests: Labs obtained in triage independently interpreted by me.  Shows leukocytosis of 12.4, consistent with infection.  Otherwise reassuring.   Imaging Studies: CT ordered from triage shows inflammation/induration, but no abscess.  No airway compromise.  Mild lymphadenopathy   Medicines ordered:  I ordered medication including percocet  for pain Reevaluation of the patient after these medicines showed that the patient improved   Disposition:  After consideration of the diagnostic results and the patients response to treatment, I feel that the patent would benefit from outpatient management with antibiotics.  Discussed with patient and mom that they are being evaluated in triage, and offered for them to continue to wait to be evaluated more completely in a room.  However mom was adamant that they should go home.  As patient's work-up is consistent with infection, but not consistent with sepsis, and at this time she does not appear to have an acute  life-threatening illness requiring hospitalization, and agreeable with discharge.  She has already been prescribed antibiotics.  Discussed pain control and importance of close follow-up with dentistry.  Encouraged return to the ER with any worsening or change symptoms.  At this time, patient appears safe for discharge.  Return precautions given.  Patient states she understands and agrees to plan  Final Clinical Impression(s) / ED Diagnoses Final diagnoses:  Dental infection    Rx / DC Orders ED Discharge Orders          Ordered    oxyCODONE-acetaminophen (PERCOCET/ROXICET) 5-325 MG tablet  Every 6 hours PRN        12/04/21 2246              Kaneesha Constantino, PA-C [id number]    Lianne Cure, DO [id number]

## 2021-12-04 NOTE — ED Triage Notes (Signed)
Pt and family reports that pt had broken tooth that has developd abscess and swelling. Pt is febrile, had increase in swelling and now difficulty swallowing fluids. Sent here from u[facility] for ct scan. Airway intact at this time.

## 2021-12-04 NOTE — ED Notes (Signed)
RN reviewed discharge instructions w/ pt. Follow up and pain management reviewed, pt had no further questions. 

## 2021-12-04 NOTE — Discharge Instructions (Signed)
Take ibuprofen 3 times a day with meals to help with pain and swelling.  Do not take other anti-inflammatories at the same time (Advil, Motrin, naproxen, Aleve). You may supplement with Tylenol if you need further pain control. Use percocet as needed for severe or breakthrough pain. Have caution, this may make your tired or groggy. Do not dive or operate heavy machinery while taking this medicine.  Follow up with the dentist as scheduled on Monday.  Take the penicillin as prescribed.  Use warm compresses to help with pain and swelling.  Return to the ER with any new, worsening, or concerning symptoms

## 2021-12-04 NOTE — ED Provider Triage Note (Signed)
Emergency Medicine Provider Triage Evaluation Note  [name] , a 21 y.o. female  was evaluated in triage.  Provided by mother who is at bedside.  Seems that she has been experiencing left-sided facial swelling, dental pain, and developed a fever.  She was seen at urgent care and sent to the emergency room for further evaluation.  Patient suffers from significant asthma and has been on prednisone frequently for this also is on immunotherapy  Per mother she is developing more swelling but has been able to manage her secretions.  Patient has complex medical history including anoxic brain injury  Review of Systems  Positive: Dental pain, fever, facial swelling Negative: Vomiting  Physical Exam  BP 127/87 (BP Location: Right Arm)    Pulse (!) 107    Temp (!) 100.7 F (38.2 C) (Oral)    Resp 20    SpO2 96%  Gen:   Awake, no distress but uncomfortable Resp:  Normal effort. MSK:   Moves extremities without difficulty. Other:  Face is somewhat asymmetric with left sided facial swelling, fever present  Medical Decision Making  Medically screening exam initiated at 2:38 PM.  Appropriate orders placed.  [name] was informed that the remainder of the evaluation will be completed by another provider, this initial triage assessment does not replace that evaluation, and the importance of remaining in the ED until their evaluation is complete.  Will provide Percocet which has Tylenol in it.  Labs and CT   Tedd Sias, [place] 12/04/21 1444

## 2021-12-05 ENCOUNTER — Telehealth: Payer: Self-pay

## 2021-12-05 NOTE — Telephone Encounter (Signed)
Received message from mother concerning pain medication. The script was sent to [facility]. , and they are closed for the weekend. Called mother back and left confidential voice message to call this CM.

## 2021-12-08 ENCOUNTER — Ambulatory Visit (INDEPENDENT_AMBULATORY_CARE_PROVIDER_SITE_OTHER): Payer: Medicaid Other | Admitting: Neurology

## 2021-12-17 ENCOUNTER — Ambulatory Visit: Payer: Medicaid Other

## 2021-12-22 ENCOUNTER — Ambulatory Visit: Payer: Medicaid Other | Admitting: Allergy & Immunology

## 2022-02-02 ENCOUNTER — Ambulatory Visit: Payer: Medicaid Other | Admitting: Allergy & Immunology

## 2022-03-11 ENCOUNTER — Telehealth: Payer: Self-pay

## 2022-03-11 ENCOUNTER — Telehealth (INDEPENDENT_AMBULATORY_CARE_PROVIDER_SITE_OTHER): Payer: Self-pay | Admitting: Neurology

## 2022-03-11 DIAGNOSIS — G252 Other specified forms of tremor: Secondary | ICD-10-CM

## 2022-03-11 DIAGNOSIS — R252 Cramp and spasm: Secondary | ICD-10-CM

## 2022-03-11 DIAGNOSIS — G931 Anoxic brain damage, not elsewhere classified: Secondary | ICD-10-CM

## 2022-03-11 MED ORDER — CLONAZEPAM 0.5 MG PO TABS: 1.0000 mg | ORAL_TABLET | Freq: Two times a day (BID) | ORAL | 1 refills | Status: DC

## 2022-03-11 NOTE — Telephone Encounter (Signed)
Spoke with mom and let her know that the provider has sent in the requested Rx for the pt.  ?

## 2022-03-11 NOTE — Telephone Encounter (Signed)
Clonazepam prescription was sent to the pharmacy.  ?

## 2022-03-11 NOTE — Telephone Encounter (Signed)
Patient is a former Dr.Hickling pt has an appt scheduled with Dr.Nab on 5/19. ?Patient is our of medicationsand is requesting a Rx Refill on ClonazePam to be sent to the [facility] ?

## 2022-03-11 NOTE — Telephone Encounter (Signed)
Mom called and stated that patient has not been in to get her [place] since 12/03/2021. Patient has been dealing with an tooth infection that has went up in her sinus area. Patient has been on antibiotics and will be having oral surgery however due to patient not being able to come in she has an eczema flare over her eye lids and back of her neck. Mom is wondering if patient may need prednisone or something to help. The creams are burning and she doesn't want to use them. Patient will also have to do a loading dose of Dupixent again and mom is wondering if she can get it or if she needs to wait until after she is off of antibiotics and has had surgery. Please advise. ?

## 2022-03-11 NOTE — Telephone Encounter (Signed)
?  Name of who is calling: [name] ? ?Caller's Relationship to Patient: mother  ? ?Best contact [phone] ? ?Provider they see: former patient of dr. Sharene Skeans  ? ?Reason for call: Patient is out of meds. Mom stated she never got info about her appointment to see new nuerologist dr. Nab/ Meanwhile scheduled an appointment to see dr. Merri Brunette and to put on cancellation list.  ? ? ? ? ?PRESCRIPTION REFILL ONLY ? ?Name of prescription: ?ClonazePam  ?Pharmacy: ?[facility]  ? ?

## 2022-03-11 NOTE — Telephone Encounter (Signed)
.  pst 

## 2022-03-12 ENCOUNTER — Other Ambulatory Visit: Payer: Self-pay | Admitting: *Deleted

## 2022-03-12 MED ORDER — PREDNISONE 10 MG PO TABS: 10.0000 mg | ORAL_TABLET | Freq: Two times a day (BID) | ORAL | 0 refills | Status: AC

## 2022-03-12 MED ORDER — ELIDEL 1 % EX CREA: TOPICAL_CREAM | CUTANEOUS | 5 refills | Status: DC

## 2022-03-12 NOTE — Telephone Encounter (Signed)
Medications have been sent in. Called and left a message on mom and dads phone asking to return call to advise and get her scheduled to come in and re-start Dupixent.  ?

## 2022-03-12 NOTE — Telephone Encounter (Signed)
Called and spoke with patients mother and advised of medications. Patient has been rescheduled to come in and restart her Dupixent this coming week. Patients mother verbalized understanding.  ?

## 2022-03-12 NOTE — Telephone Encounter (Signed)
Let's send in a short prednisone burst to get this skin healing: 10mg  BID for 7 days. ? ?Then start Protopic 2-3 times daily as needed for the eyelids and the back of the neck.  ? ?Let's get Dupixent back on board. I am fine with her getting it on antibiotics. ? ? , MD ?[facility] ? ?

## 2022-03-18 ENCOUNTER — Ambulatory Visit: Payer: Medicaid Other

## 2022-04-01 ENCOUNTER — Ambulatory Visit (INDEPENDENT_AMBULATORY_CARE_PROVIDER_SITE_OTHER): Payer: Medicaid Other | Admitting: Allergy & Immunology

## 2022-04-01 ENCOUNTER — Ambulatory Visit: Payer: Medicaid Other

## 2022-04-01 ENCOUNTER — Encounter: Payer: Self-pay | Admitting: Allergy & Immunology

## 2022-04-01 VITALS — BP 102/70 | HR 100 | Temp 97.4°F | Resp 20 | Ht 64.0 in | Wt 231.0 lb

## 2022-04-01 DIAGNOSIS — J3089 Other allergic rhinitis: Secondary | ICD-10-CM | POA: Diagnosis not present

## 2022-04-01 DIAGNOSIS — J4550 Severe persistent asthma, uncomplicated: Secondary | ICD-10-CM | POA: Diagnosis not present

## 2022-04-01 DIAGNOSIS — J302 Other seasonal allergic rhinitis: Secondary | ICD-10-CM

## 2022-04-01 DIAGNOSIS — R112 Nausea with vomiting, unspecified: Secondary | ICD-10-CM

## 2022-04-01 DIAGNOSIS — L2089 Other atopic dermatitis: Secondary | ICD-10-CM | POA: Diagnosis not present

## 2022-04-01 MED ORDER — BUDESONIDE-FORMOTEROL FUMARATE 160-4.5 MCG/ACT IN AERO: 2.0000 | INHALATION_SPRAY | Freq: Two times a day (BID) | RESPIRATORY_TRACT | 5 refills | Status: DC

## 2022-04-01 MED ORDER — LEVOCETIRIZINE DIHYDROCHLORIDE 5 MG PO TABS: 5.0000 mg | ORAL_TABLET | Freq: Every evening | ORAL | 5 refills | Status: DC

## 2022-04-01 MED ORDER — ALBUTEROL SULFATE HFA 108 (90 BASE) MCG/ACT IN AERS: 2.0000 | INHALATION_SPRAY | RESPIRATORY_TRACT | 1 refills | Status: DC | PRN

## 2022-04-01 MED ORDER — CLOBETASOL PROPIONATE 0.05 % EX SHAM: 1.0000 "application " | MEDICATED_SHAMPOO | Freq: Two times a day (BID) | CUTANEOUS | 3 refills | Status: DC

## 2022-04-01 MED ORDER — OMEPRAZOLE 20 MG PO CPDR
20.0000 mg | DELAYED_RELEASE_CAPSULE | Freq: Every day | ORAL | 0 refills | Status: DC
Start: 2022-04-01 — End: 2022-11-02

## 2022-04-01 MED ORDER — MONTELUKAST SODIUM 10 MG PO TABS: 10.0000 mg | ORAL_TABLET | Freq: Every day | ORAL | 5 refills | Status: DC

## 2022-04-01 MED ORDER — FLOVENT HFA 110 MCG/ACT IN AERO: INHALATION_SPRAY | RESPIRATORY_TRACT | 5 refills | Status: DC

## 2022-04-01 NOTE — Patient Instructions (Addendum)
1. Allergic rhinoconjunctivitis ?- Continue with the Singulair 10mg  daily.  ?- Continue with Xyzal (levocetirizine) 5mg  tablet once daily.  ? ?2. Eczema ?- Restarted the Dupient today with a loading dose.  ?- Continue with clobetasol shampoo twice every other week like you are doing. ? ?3. Severe persistent asthma, uncomplicated ?- Lung testing looked a bit worse.  ?- Daily controller medication(s): Symbicort 160/4.5 two puffs twice daily with spacer + Flovent two puffs twice daily  + Singulair 10mg  daily + Dupixent every 2 weeks ?- Rescue medications: ProAir 4 puffs every 4-6 hours as needed or albuterol nebulizer one vial puffs every 4-6 hours as needed ?- Asthma control goals:  ?* Full participation in all desired activities (may need albuterol before activity) ?* Albuterol use two time or less a week on average (not counting use with activity) ?* Cough interfering with sleep two time or less a month ?* Oral steroids no more than once a year ?* No hospitalizations ? ?4. GERD ?- Continue with omeprazole daily.    ? ?5. Return in about 6 months (around 10/02/2022).  ? ? ?Please inform of any Emergency Department visits, hospitalizations, or changes in symptoms. Call before going to the ED for breathing or allergy symptoms since we might be able to fit you in for a sick visit. Feel free to contact 13/09/2022 anytime with any questions, problems, or concerns. ? ?It was a pleasure to see you and your family again today! ? ?Websites that have reliable patient information: ?1. American Academy of Asthma, Allergy, and Immunology: www.aaaai.org ?2. Food Allergy Research and Education (FARE): foodallergy.org ?3. Mothers of Asthmatics: http://www.asthmacommunitynetwork.org ?4. [place] of Allergy, Asthma, and Immunology: [place] ? ? ?COVID-19 Vaccine Information can be found at: [place] For questions related to vaccine distribution or  appointments, please email [email or web address] or call [phone].  ? ?We realize that you might be concerned about having an allergic reaction to the COVID19 vaccines. To help with that concern, WE ARE OFFERING THE COVID19 VACCINES IN OUR OFFICE! Ask the front desk for dates!  ? ? ? ??Like? [email or web address] on Facebook and Instagram for our latest updates!  ?  ? ? ?A healthy democracy works best when [email or web address] participate! Make sure you are registered to vote! If you have moved or changed any of your contact information, you will need to get this updated before voting! ? ?In some cases, you MAY be able to register to vote online: [phone] ? ? ? ? ? ? ? ? ? ?

## 2022-04-01 NOTE — Progress Notes (Signed)
? ?FOLLOW UP ? ?Date of Service/Encounter:  04/01/22 ? ? ?Assessment:  ? ?Eczema - was better controlled on the Dupixent when she was getting it routinely ?  ?Severe persistent asthma - doing very well on Fasenra every 8 weeks (prednisone x 1 in > 1 year), but now with atopic dermatitis and changing to [place] ?  ?Seasonal and perennial allergies rhinitis  ?  ?COVID positive testing (Fall 2020) - now fully immunized ?  ?Complicated history, including a prolonged hospitalization following respiratory arrest in the field (Oct 2018) ?  ?Multiple complications secondary to recurrent prednisone courses, including bone abnormalities ?  ?History of non-compliance - improved ? ? ?Unfortunately, her asthma and her skin are not under good control since she has not had Dupixent in a number of months. We did restart that today and in retrospect she likely should have had a new loading dose given the time between her Dupixent injections. Nonetheless, hopefully we are going to get back on track.  Mom is interested in giving Pulcifer at home today, but we have been hesitant to do that in the past given her history of noncompliance.  No family is noncompliance in the past had a direct part in her having respiratory arrest in the field.  However, I think the family is in a much better position now.  I also think that the improvement of her skin on the Dupixent will keep her using the [place] regularly at home.  I will talk to Tammy about getting it changed to administration at home.  We can monitor how often she is getting it through the pharmacy. ?  ? ?Plan/Recommendations:  ? ? ?1. Allergic rhinoconjunctivitis ?- Continue with the Singulair 71m daily.  ?- Continue with Xyzal (levocetirizine) 532mtablet once daily.  ? ?2. Eczema ?- Restarted the Dupient today with a loading dose.  ?- Continue with clobetasol shampoo twice every other week like you are doing. ? ?3. Severe persistent asthma, uncomplicated ?- Lung testing looked a  bit worse.  ?- Daily controller medication(s): Symbicort 160/4.5 two puffs twice daily with spacer + Flovent [id number] puffs twice daily  + Singulair 70m72mily + Dupixent every 2 weeks ?- Rescue medications: ProAir 4 puffs every 4-6 hours as needed or albuterol nebulizer one vial puffs every 4-6 hours as needed ?- Asthma control goals:  ?* Full participation in all desired activities (may need albuterol before activity) ?* Albuterol use two time or less a week on average (not counting use with activity) ?* Cough interfering with sleep two time or less a month ?* Oral steroids no more than once a year ?* No hospitalizations ? ?4. GERD ?- Continue with omeprazole daily.    ? ?5. Return in about 6 months (around 10/02/2022). ? ?Subjective:  ? ?Janet P AganHinderlitera 21 y72. female presenting today for follow up of  ?Chief Complaint  ?Patient presents with  ? Asthma  ?  Been a little wheezy.  ? Other  ?  Break out on her neck and face and behind the ears. Had an episode with dental issues (infection) and has to go back to the dentist consistantly.  ? Allergic Rhinitis   ?  Flared  ? ? ?[name] has a history of the following: ?Patient Active Problem List  ? Diagnosis Date Noted  ? Muscle spasm of back 07/14/2019  ? Pyelonephritis 07/12/2019  ? COVID-19 07/12/2019  ? Peripheral neuropathy 08/24/2018  ? Poor sleep hygiene 07/11/2018  ?  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 Left hemiparesis ([place]) 01/17/2018  ? Abnormality on bone densitometry 11/28/2017  ? Anoxic brain injury ([place]) 09/28/2017  ? Sympathetic storming 09/28/2017  ? Spasticity 09/28/2017  ? Encephalopathy 09/19/2017  ? PCOS (polycystic ovarian syndrome) 09/29/2016  ? Low bone density 09/29/2016  ? 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 Severe asthma with acute exacerbation 07/26/2015  ? Seasonal and perennial allergic rhinitis 07/26/2015  ? Obesity peds (BMI >=95 percentile)  02/29/2012  ? ? ?History obtained from: chart review and patient and mother. ? ?[name] is a 21 y.o. female presenting for a follow up visit. She was last seen in January 2023. At that time, we continued with Singulair as well as levocetirizine.  ? ?She is having some dental issues. She has been getting round after round of antibiotics for this dental issues. She has not met the oral surgeon. All they have to do is pop the tooth out and clear out the infection.  ? ?Asthma/Respiratory Symptom History: She was getting  Dupixent but she has not gotten it since September. She has remained on her inhalers including Symbicort and Flovent BID. She is compliant with this, per Mom. But they always have trouble getting in to get Dupixent. Mom is interested in giving it to [facility] herself at home to help with the transportation issues.  ? ?Allergic Rhinitis Symptom History: She remains on her Singulair and her Xyzal. She is not using a nose spray, which has been a contentious topic since I have known her. She is NOT a fan of nose sprays at all. She has not needed any antibiotics at all since I have seen her last.  ? ?Skin Symptom History: She has a breakout on her face, neck, and behind her ears.  She needed a refill of the steroid shampoo that she was using. She also thinks that her skin was under better control with the [place]. She is scratching incessantly with the outbreak of the eczema.  ? ?Otherwise, there have been no changes to her past medical history, surgical history, family history, or social history. ? ? ? ?Review of Systems  ?Constitutional: Negative.  Negative for chills, fever, malaise/fatigue and weight loss.  ?HENT:  Positive for congestion. Negative for ear discharge, ear pain and nosebleeds.   ?Eyes:  Negative for pain, discharge and redness.  ?Respiratory:  Negative for cough, sputum production, shortness of breath and wheezing.   ?Cardiovascular: Negative.  Negative for chest pain and palpitations.   ?Gastrointestinal:  Negative for abdominal pain, constipation, diarrhea, heartburn, nausea and vomiting.  ?Skin:  Positive for itching and rash.  ?Neurological:  Negative for dizziness and headaches.  ?Endo/Heme/Allergies:  Negative for environmental allergies. Does not bruise/bleed easily.   ? ? ? ?Objective:  ? ?Blood pressure 102/70, pulse 100, temperature (!) 97.4 ?F (36.3 ?C), temperature source Temporal, resp. rate 20, height 5' 4" (1.626 m), weight 231 lb (104.8 kg), SpO2 95 %. ?Body mass index is 39.65 kg/m?. ? ? ? ?Physical Exam ?Vitals reviewed.  ?Constitutional:   ?   Appearance: She is well-developed.  ?HENT:  ?   Head: Normocephalic and atraumatic.  ?   Right Ear: Tympanic membrane, ear canal and external ear normal.  ?   Left Ear: Tympanic membrane, ear canal and external ear normal.  ?   Nose: No nasal deformity, septal deviation, mucosal edema or rhinorrhea.  ?   Right Turbinates: Enlarged, swollen and pale.  ?  Left Turbinates: Enlarged, swollen and pale.  ?   Right Sinus: No maxillary sinus tenderness or frontal sinus tenderness.  ?   Left Sinus: No maxillary sinus tenderness or frontal sinus tenderness.  ?   Mouth/Throat:  ?   Mouth: Mucous membranes are not pale and not dry.  ?   Pharynx: Uvula midline.  ?Eyes:  ?   General: Lids are normal. No allergic shiner.    ?   Right eye: No discharge.     ?   Left eye: No discharge.  ?   Conjunctiva/sclera: Conjunctivae normal.  ?   Right eye: Right conjunctiva is not injected. No chemosis. ?   Left eye: Left conjunctiva is not injected. No chemosis. ?   Pupils: Pupils are equal, round, and reactive to light.  ?Cardiovascular:  ?   Rate and Rhythm: Normal rate and regular rhythm.  ?   Heart sounds: Normal heart sounds.  ?Pulmonary:  ?   Effort: Pulmonary effort is normal. No tachypnea, accessory muscle usage or respiratory distress.  ?   Breath sounds: Normal breath sounds. No wheezing, rhonchi or rales.  ?   Comments: Moving air well in all lung  fields.  There some slight expiratory wheezing at the bases. ?Chest:  ?   Chest wall: No tenderness.  ?Lymphadenopathy:  ?   Cervical: No cervical adenopathy.  ?Skin: ?   General: Skin is warm.  ?   Capillary Refill: C

## 2022-04-02 ENCOUNTER — Telehealth: Payer: Self-pay

## 2022-04-02 MED ORDER — CLOBETASOL PROPIONATE 0.05 % EX SOLN: 1.0000 "application " | Freq: Two times a day (BID) | CUTANEOUS | 2 refills | Status: DC

## 2022-04-02 MED ORDER — CLOBETASOL PROPIONATE 0.05 % EX FOAM: CUTANEOUS | 2 refills | Status: DC

## 2022-04-02 NOTE — Telephone Encounter (Signed)
[facility] called stating that the clobetasol shampoo is on back order. Please advise an alternative for the pharmacy to fill for the patient. ?

## 2022-04-02 NOTE — Telephone Encounter (Signed)
That is fine with me. I sent in the prescription. ? ?Salvatore Marvel, MD ?[facility] ? ?

## 2022-04-02 NOTE — Telephone Encounter (Signed)
Sam/Pharmacist from [name] called in - DOB verified - advising they're out of stock of foam only have application (solution) - will this alternative be okay? ?

## 2022-04-02 NOTE — Addendum Note (Signed)
Addended by: Alfonse Spruce on: 04/02/2022 04:44 PM ? ? Modules accepted: Orders ? ?

## 2022-04-02 NOTE — Telephone Encounter (Signed)
I sent in clobetasol foam to be used in the hair instead. ?

## 2022-04-02 NOTE — Addendum Note (Signed)
Addended by: Valentina Shaggy on: 04/02/2022 11:13 AM ? ? Modules accepted: Orders ? ?

## 2022-04-05 ENCOUNTER — Telehealth: Payer: Self-pay | Admitting: *Deleted

## 2022-04-05 NOTE — Telephone Encounter (Signed)
Patient still has current Rx at [facility] and I have got her reapproved with [facility]. Tried to reach mother but unable to leave message to discuss form of Dupixent and delivery to home ?

## 2022-04-05 NOTE — Telephone Encounter (Signed)
-----   Message from Alfonse Spruce, MD sent at 04/03/2022  7:12 AM EDT ----- ?Hi there! [name]'s mom wants to give Dupixent at home. I am skeptical but I would rather her get it at least monthly rather than the poor compliance she has had with coming in to get it. We can monitor how often it is at least being delivered to their home, right? ?

## 2022-04-09 ENCOUNTER — Ambulatory Visit (INDEPENDENT_AMBULATORY_CARE_PROVIDER_SITE_OTHER): Payer: Medicaid Other | Admitting: Neurology

## 2022-04-09 ENCOUNTER — Encounter (INDEPENDENT_AMBULATORY_CARE_PROVIDER_SITE_OTHER): Payer: Self-pay | Admitting: Neurology

## 2022-04-09 VITALS — HR 89 | Ht 64.69 in | Wt 228.2 lb

## 2022-04-09 DIAGNOSIS — G253 Myoclonus: Secondary | ICD-10-CM

## 2022-04-09 DIAGNOSIS — G252 Other specified forms of tremor: Secondary | ICD-10-CM

## 2022-04-09 DIAGNOSIS — R269 Unspecified abnormalities of gait and mobility: Secondary | ICD-10-CM

## 2022-04-09 DIAGNOSIS — Z72821 Inadequate sleep hygiene: Secondary | ICD-10-CM | POA: Diagnosis not present

## 2022-04-09 DIAGNOSIS — M62830 Muscle spasm of back: Secondary | ICD-10-CM

## 2022-04-09 DIAGNOSIS — G931 Anoxic brain damage, not elsewhere classified: Secondary | ICD-10-CM

## 2022-04-09 DIAGNOSIS — R252 Cramp and spasm: Secondary | ICD-10-CM

## 2022-04-09 MED ORDER — CLONIDINE HCL 0.1 MG PO TABS: ORAL_TABLET | ORAL | 8 refills | Status: DC

## 2022-04-09 MED ORDER — GABAPENTIN 300 MG PO CAPS: 300.0000 mg | ORAL_CAPSULE | Freq: Three times a day (TID) | ORAL | 7 refills | Status: DC

## 2022-04-09 MED ORDER — LEVETIRACETAM 500 MG PO TABS: 500.0000 mg | ORAL_TABLET | Freq: Two times a day (BID) | ORAL | 7 refills | Status: DC

## 2022-04-09 MED ORDER — TRAZODONE HCL 50 MG PO TABS: ORAL_TABLET | ORAL | 7 refills | Status: DC

## 2022-04-09 MED ORDER — BACLOFEN 20 MG PO TABS: 20.0000 mg | ORAL_TABLET | Freq: Three times a day (TID) | ORAL | 7 refills | Status: DC

## 2022-04-09 MED ORDER — CLONAZEPAM 0.5 MG PO TABS: 1.0000 mg | ORAL_TABLET | Freq: Two times a day (BID) | ORAL | 5 refills | Status: DC

## 2022-04-09 NOTE — Patient Instructions (Signed)
Continue the same dose of medications for now She needs to have adequate and appropriate night for sleep She needs to have regular exercise as tolerated Follow-up with primary care physician for routine checkup and yearly blood work May benefit from a referral to adult rehabilitation service for further management of spasticity and if any other services needed Return in 8 months for follow-up visit

## 2022-04-09 NOTE — Progress Notes (Signed)
Patient: [name] MRN: [id number] Sex: female DOB: [birth year]  Provider: Teressa Lower, MD Location of Care: [facility]  Note type: New patient consultation  Referral Source: [facility] History from: both parents, patient, and [facility] chart Chief Complaint: Spastic quadriparesis, routine follow up  History of Present Illness: [name] is a 21 y.o. female is here for follow-up management of hypoxic injury. Patient has been seen and followed by Dr. Gaynell Face with the last visit in September 2022. 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 Overall, she has been stable over the past year without any new complaints and has been taking her medications regularly without any side effects.  She has not had any seizure activity recently without any change in behavior or mood although still having significant difficulty sleeping at night and usually she is awake during the night and usually sleeps during the daytime. She has been doing well in terms of eating and drinking but she does have some difficulty with urine control but without having any significant issues with bowel control.  She is not very active physically but she is able to walk with walker on a regular basis.  Review of Systems: Review of system as per HPI, otherwise negative.  Past Medical History:  Diagnosis Date   Allergy    Allergy    Multiple allergies - see allergy list   Alopecia    Asthma    Asthma    Family history of adverse reaction to anesthesia    Mom - Difficult to anesthesize/Hypotension   Headache    With wheezing per Mom   Obesity    Steroid induced per Mom   Pyelonephritis  07/12/2019   Vision abnormalities    Diminished vision Left Eye per Mom   Hospitalizations: No., Head Injury: No., Nervous System Infections: No., Immunizations up to date: Yes.     Surgical History Past Surgical History:  Procedure Laterality Date   ADENOIDECTOMY     ESOPHAGOGASTRODUODENOSCOPY (EGD) WITH PROPOFOL N/A 09/27/2017   Procedure: ESOPHAGOGASTRODUODENOSCOPY (EGD) WITH PROPOFOL;  Surgeon: Joycelyn Rua, MD;  Location: [place];  Service: Gastroenterology;  Laterality: N/A;   PEG PLACEMENT N/A 09/27/2017   Procedure: PERCUTANEOUS ENDOSCOPIC GASTROSTOMY (PEG) PLACEMENT;  Surgeon: Joycelyn Rua, MD;  Location: [place];  Service: Gastroenterology;  Laterality: N/A;   TONSILLECTOMY     WISDOM TOOTH EXTRACTION      Family History family history includes Asthma in her mother and paternal grandmother; Cancer in her maternal grandfather, mother, and paternal grandmother; Depression in her brother; Diabetes in her maternal grandfather and maternal grandmother; Hypertension in her maternal grandfather and maternal grandmother; Vision loss in her mother.  Social History Social History   Socioeconomic History   Marital status: Single    Spouse name: Not on file   Number of children: Not on file   Years of education: Not on file   Highest education level: Not on file  Occupational History   Not on file  Tobacco Use   Smoking status: Never   Smokeless tobacco: Never   Tobacco comments:    no smokers in the home  Vaping Use   Vaping Use: Never used  Substance and Sexual Activity   Alcohol use: No   Drug use: No  Sexual activity: Never    Comment: Metformin for irregular menstrual cycles  Other Topics Concern   Not on file  Social History Narrative   Aprilmarie is currently not in school   Patient lives at home with mom, dad, and brother.    Social Determinants of Health   Financial Resource Strain: Not on file  Food Insecurity: Not on file  Transportation Needs:  Not on file  Physical Activity: Not on file  Stress: Not on file  Social Connections: Not on file     Allergies  Allergen Reactions   Azithromycin Anaphylaxis   Biaxin [Clarithromycin] Anaphylaxis   Erythromycin Anaphylaxis   Macrolides And Ketolides Anaphylaxis   Nitrofurantoin Monohyd Macro Anaphylaxis   Quinolones Anaphylaxis   Troleandomycin Anaphylaxis   Mango Flavor Hives    Physical Exam Pulse 89   Ht 5' 4.69" (1.643 m)   Wt 228 lb 2.8 oz (103.5 kg)   LMP  (LMP Unknown) Comment: irregular periods  BMI 38.34 kg/m  Gen: Awake, alert, not in distress,  Skin: No neurocutaneous stigmata, no rash HEENT: Normocephalic, no dysmorphic features, no conjunctival injection, nares patent, mucous membranes moist, oropharynx clear. Neck: Supple, no meningismus, no lymphadenopathy,  Resp: Clear to auscultation bilaterally CV: Regular rate, normal S1/S2, no murmurs, no rubs Abd: Bowel sounds present, abdomen soft, non-tender, non-distended.  No hepatosplenomegaly or mass. Ext: Warm and well-perfused. No deformity, no muscle wasting, ROM full.  Neurological Examination: MS- Awake, alert, interactive Cranial Nerves- Pupils equal, round and reactive to light (5 to 62mm); fix and follows with full and smooth EOM; no nystagmus; no ptosis, funduscopy with normal sharp discs, visual field full by looking at the toys on the side, face symmetric with smile.  Hearing intact to bell bilaterally, palate elevation is symmetric, and tongue protrusion is symmetric. Tone- Normal, slightly increased in lower extremities Strength-Seems to have good strength, symmetrically by observation and passive movement. Reflexes-    Biceps Triceps Brachioradialis Patellar Ankle  R 2+ 2+ 2+ 2+ 2+  L 2+ 2+ 2+ 2+ 2+   Plantar responses flexor bilaterally, no clonus noted Sensation- Withdraw at four limbs to stimuli. Coordination- Reached to the object with no dysmetria Gait: Able to walk with the help of a  walker without any issues   Assessment and Plan 1. Anoxic brain injury ([place])   2. Intention tremor   3. Spasticity   4. Neurologic gait disorder   5. Action induced myoclonus   6. Poor sleep hygiene   7. Muscle spasm of back    This is a 21 year old female with a significant hypoxic injury following cardiopulmonary arrest in 2018 with some quadriparesis and other issues as mentioned, currently on multiple medications with stable condition over the past couple of years.  Her physical exam is stable and the same as previous exam with Dr. Gaynell Face. Recommend to continue the same dose of medications as mentioned below. We discussed regarding more physical activity on a daily basis which would be a kind of physical therapy for her and she needs to do it on a regular and daily basis. Also she needs to have better sleep through the night so we discussed regarding waking up earlier in the morning and 9 or 10 and try not to take a nap in the afternoon so she will be able to go to bed at 10 or 11 PM and sleep better through the night with the help of medications including trazodone and clonidine. At this time she does not  need further neurological testing. I think she may need to get a referral to see adult rehabilitation service which would help her more in terms of what she needs for better ambulation and rehabilitation over the next several years and if there are any other resources needed.  Mother needs to get a referral from her primary care physician. I would like to see her in 8 months for follow-up visit to refill her medications and parents will call my office if there is any new neurological concern.  Both parents understood and agreed with the plan.  Meds ordered this encounter  Medications   traZODone (DESYREL) 50 MG tablet    Sig: TAKE 1 TABLET BY MOUTH BEFORE BEDTIME    Dispense:  30 tablet    Refill:  7   levETIRAcetam (KEPPRA) 500 MG tablet    Sig: Take 1 tablet (500 mg total) by  mouth 2 (two) times daily.    Dispense:  62 tablet    Refill:  7   gabapentin (NEURONTIN) 300 MG capsule    Sig: Take 1 capsule (300 mg total) by mouth 3 (three) times daily.    Dispense:  100 capsule    Refill:  7   cloNIDine (CATAPRES) 0.1 MG tablet    Sig: TAKE ONE TABLET BY MOUTH TWICE A DAY *MAKE APPT FOR FURTHER REFILLS*    Dispense:  60 tablet    Refill:  8   clonazePAM (KLONOPIN) 0.5 MG tablet    Sig: Take 2 tablets (1 mg total) by mouth 2 (two) times daily.    Dispense:  124 tablet    Refill:  5   baclofen (LIORESAL) 20 MG tablet    Sig: Take 1 tablet (20 mg total) by mouth 3 (three) times daily.    Dispense:  100 tablet    Refill:  7   No orders of the defined types were placed in this encounter.

## 2022-04-15 ENCOUNTER — Ambulatory Visit: Payer: Medicaid Other

## 2022-04-15 NOTE — Telephone Encounter (Signed)
Tried to reach mother again but unable to leave message

## 2022-04-16 ENCOUNTER — Ambulatory Visit: Payer: Medicaid Other

## 2022-04-16 NOTE — Telephone Encounter (Signed)
Mom l/m for me last night at 5:30 to call her back. I attempted callback today with no answer

## 2022-04-23 ENCOUNTER — Ambulatory Visit: Payer: Medicaid Other

## 2022-04-23 DIAGNOSIS — J4550 Severe persistent asthma, uncomplicated: Secondary | ICD-10-CM

## 2022-04-23 NOTE — Progress Notes (Signed)
Patient came in with mom for Dupixent injection. Teaching was provided and mom administered injection to patient. Patient will be doing Dupixent injections at home every 2 weeks.

## 2022-04-27 NOTE — Telephone Encounter (Signed)
Called mother and she answered and she advised she has gotten patient Dupixent and started it at home

## 2022-04-27 NOTE — Telephone Encounter (Signed)
Can we get updates from her home health company or whoever provides the medication? I just anticipate Mom not giving it routinely. Paint me skeptical.  Malachi Bonds, MD [facility]

## 2022-04-28 NOTE — Telephone Encounter (Signed)
I am in contact with pharmacy and will keep eye on Rx going out

## 2022-04-29 NOTE — Telephone Encounter (Signed)
Thanks, Tammy!   Orvilla Truett, MD [facility]  

## 2022-06-25 ENCOUNTER — Other Ambulatory Visit: Payer: Self-pay | Admitting: Allergy & Immunology

## 2022-08-06 ENCOUNTER — Other Ambulatory Visit: Payer: Self-pay | Admitting: Allergy & Immunology

## 2022-08-18 ENCOUNTER — Telehealth: Payer: Self-pay | Admitting: *Deleted

## 2022-08-18 ENCOUNTER — Other Ambulatory Visit (HOSPITAL_COMMUNITY): Payer: Self-pay

## 2022-08-18 MED ORDER — DUPIXENT 300 MG/2ML ~~LOC~~ SOSY
300.0000 mg | PREFILLED_SYRINGE | SUBCUTANEOUS | 11 refills | Status: DC
  Filled 2022-08-18 – 2022-08-20 (×2): qty 4, 28d supply, fill #0
  Filled 2022-09-24: qty 4, 28d supply, fill #1
  Filled 2022-10-20: qty 4, 28d supply, fill #2
  Filled 2022-11-12: qty 4, 28d supply, fill #3
  Filled 2023-02-03: qty 4, 28d supply, fill #4
  Filled 2023-03-02: qty 4, 28d supply, fill #5
  Filled 2023-04-21: qty 4, 28d supply, fill #6
  Filled 2023-05-18: qty 4, 28d supply, fill #7
  Filled 2023-06-28: qty 4, 28d supply, fill #8
  Filled 2023-07-27: qty 4, 28d supply, fill #9

## 2022-08-18 NOTE — Telephone Encounter (Signed)
Patient mother advised of change for Rx Dupixent from [facility] to [facility] pharmacy due to [facility] no longer dispensing biologics 

## 2022-08-19 ENCOUNTER — Other Ambulatory Visit (HOSPITAL_COMMUNITY): Payer: Self-pay

## 2022-08-20 ENCOUNTER — Other Ambulatory Visit (HOSPITAL_COMMUNITY): Payer: Self-pay

## 2022-08-26 ENCOUNTER — Other Ambulatory Visit (HOSPITAL_COMMUNITY): Payer: Self-pay

## 2022-09-06 ENCOUNTER — Other Ambulatory Visit: Payer: Self-pay | Admitting: Allergy & Immunology

## 2022-09-07 ENCOUNTER — Ambulatory Visit: Payer: Medicaid Other | Admitting: Allergy & Immunology

## 2022-09-08 ENCOUNTER — Other Ambulatory Visit (HOSPITAL_COMMUNITY): Payer: Self-pay

## 2022-09-14 ENCOUNTER — Other Ambulatory Visit (HOSPITAL_COMMUNITY): Payer: Self-pay

## 2022-09-16 ENCOUNTER — Ambulatory Visit: Payer: Medicaid Other | Admitting: Allergy & Immunology

## 2022-09-21 ENCOUNTER — Other Ambulatory Visit (HOSPITAL_COMMUNITY): Payer: Self-pay

## 2022-09-23 ENCOUNTER — Ambulatory Visit: Payer: Medicaid Other | Admitting: Allergy & Immunology

## 2022-09-23 ENCOUNTER — Other Ambulatory Visit (HOSPITAL_COMMUNITY): Payer: Self-pay

## 2022-09-24 ENCOUNTER — Other Ambulatory Visit (HOSPITAL_COMMUNITY): Payer: Self-pay

## 2022-09-27 ENCOUNTER — Other Ambulatory Visit (HOSPITAL_COMMUNITY): Payer: Self-pay

## 2022-10-05 ENCOUNTER — Other Ambulatory Visit (HOSPITAL_COMMUNITY): Payer: Self-pay

## 2022-10-05 ENCOUNTER — Telehealth: Payer: Self-pay

## 2022-10-05 NOTE — Telephone Encounter (Signed)
PA request received from patients pharmacy for [place] 1%.  PA sent by fax to [facility] Medicaid.  Awaiting determination.

## 2022-10-17 ENCOUNTER — Other Ambulatory Visit: Payer: Self-pay | Admitting: Internal Medicine

## 2022-10-17 MED ORDER — PREDNISONE 20 MG PO TABS: ORAL_TABLET | ORAL | 0 refills | Status: DC

## 2022-10-17 MED ORDER — IPRATROPIUM-ALBUTEROL 0.5-2.5 (3) MG/3ML IN SOLN: 3.0000 mL | Freq: Four times a day (QID) | RESPIRATORY_TRACT | 5 refills | Status: DC | PRN

## 2022-10-17 MED ORDER — AMOXICILLIN-POT CLAVULANATE 875-125 MG PO TABS: 1.0000 | ORAL_TABLET | Freq: Two times a day (BID) | ORAL | 0 refills | Status: AC

## 2022-10-17 NOTE — Addendum Note (Signed)
Addended by: Ralph Leyden on: 10/17/2022 10:03 PM   Modules accepted: Orders

## 2022-10-17 NOTE — Progress Notes (Signed)
Patient developed a chest cold over the weekend.  Now with wheezing despite q2 hour albuterol.  They do not have there normal emergency prednisone at home to use per asthma action plan.  Will call in predisone burst, augmentin and duonebs.  If she is not better within 24 hours recommend taking her to ED.  Mother voiced understanding of plan.  Prescriptions sent to 24 hour pharmacy for pick up.

## 2022-10-19 ENCOUNTER — Other Ambulatory Visit: Payer: Self-pay | Admitting: Allergy & Immunology

## 2022-10-20 ENCOUNTER — Other Ambulatory Visit (HOSPITAL_COMMUNITY): Payer: Self-pay

## 2022-10-25 ENCOUNTER — Other Ambulatory Visit (HOSPITAL_COMMUNITY): Payer: Self-pay

## 2022-10-26 ENCOUNTER — Other Ambulatory Visit (HOSPITAL_COMMUNITY): Payer: Self-pay

## 2022-10-26 NOTE — Telephone Encounter (Signed)
PA has been APPROVED per test claims.

## 2022-11-01 ENCOUNTER — Other Ambulatory Visit: Payer: Self-pay | Admitting: Allergy & Immunology

## 2022-11-02 ENCOUNTER — Ambulatory Visit (INDEPENDENT_AMBULATORY_CARE_PROVIDER_SITE_OTHER): Payer: Medicaid Other | Admitting: Allergy & Immunology

## 2022-11-02 ENCOUNTER — Encounter: Payer: Self-pay | Admitting: Allergy & Immunology

## 2022-11-02 VITALS — BP 102/60 | HR 136 | Temp 97.3°F | Resp 16 | Wt 233.2 lb

## 2022-11-02 DIAGNOSIS — J302 Other seasonal allergic rhinitis: Secondary | ICD-10-CM

## 2022-11-02 DIAGNOSIS — J4550 Severe persistent asthma, uncomplicated: Secondary | ICD-10-CM | POA: Diagnosis not present

## 2022-11-02 DIAGNOSIS — L2089 Other atopic dermatitis: Secondary | ICD-10-CM

## 2022-11-02 DIAGNOSIS — R0602 Shortness of breath: Secondary | ICD-10-CM

## 2022-11-02 DIAGNOSIS — R0989 Other specified symptoms and signs involving the circulatory and respiratory systems: Secondary | ICD-10-CM

## 2022-11-02 DIAGNOSIS — J3089 Other allergic rhinitis: Secondary | ICD-10-CM | POA: Diagnosis not present

## 2022-11-02 MED ORDER — BUDESONIDE 0.5 MG/2ML IN SUSP: RESPIRATORY_TRACT | 1 refills | Status: DC

## 2022-11-02 MED ORDER — DOXYCYCLINE MONOHYDRATE 100 MG PO TABS: 100.0000 mg | ORAL_TABLET | Freq: Two times a day (BID) | ORAL | 0 refills | Status: AC

## 2022-11-02 MED ORDER — METHYLPREDNISOLONE ACETATE 80 MG/ML IJ SUSP
80.0000 mg | Freq: Once | INTRAMUSCULAR | Status: AC
Start: 2022-11-02 — End: 2022-11-02
  Administered 2022-11-02: 80 mg via INTRAMUSCULAR

## 2022-11-02 NOTE — Progress Notes (Signed)
FOLLOW UP  Date of Service/Encounter:  11/02/22   Assessment:   Eczema - now better controlled on Dupixent    Severe persistent asthma - now back on Dupixent   Seasonal and perennial allergies rhinitis    Complicated history, including a prolonged hospitalization following respiratory arrest in the field (Oct 2018)   Multiple complications secondary to recurrent prednisone courses, including bone abnormalities   History of non-compliance - improved  Plan/Recommendations:    Patient Instructions  1. Allergic rhinoconjunctivitis - Continue with the Singulair 10mg  daily.  - Continue with Xyzal (levocetirizine) 5mg  tablet once daily.   2. Eczema - Restarted the Dupient today with a loading dose.  - Continue with clobetasol shampoo twice every other week like you are doing.  3. Severe persistent asthma, uncomplicated - We did not do lung testing since you were sick today. - DepoMedrol injection given today. - This will last for 3-4 days in her system and hopefully start to break this up.  - We are getting a chest X-ray to see whether we are missing something. - We are adding on doxycycline 100mg  twice daily for ten days.  - Daily controller medication(s): Symbicort 160/4.5 two puffs twice daily with spacer + Flovent two puffs twice daily  + Singulair 10mg  daily + Dupixent every 2 weeks - Rescue medications: albuterol 4 puffs every 4-6 hours as needed, albuterol nebulizer one vial puffs every 4-6 hours as needed, or DuoNeb nebulizer one vial every 4-6 hours as needed - Changes during respiratory infections or worsening symptoms: Add on Pulmicort 0.5 mg nebulizer to THREE TREATMENTS three times daily for TWO WEEKS. - Asthma control goals:  * Full participation in all desired activities (may need albuterol before activity) * Albuterol use two time or less a week on average (not counting use with activity) * Cough interfering with sleep two time or less a month * Oral  steroids no more than once a year * No hospitalizations  4. GERD - Continue with omeprazole daily.     5. Return in about 6 weeks (around 12/14/2022).    Subjective:   [name] is a 21 y.o. female presenting today for follow up of  Chief Complaint  Patient presents with   Follow-up    Mom states that the inhalers aren't really working for her. Coming off a cold. Around Thanksgiving had an episode and doctor gave antibiotics, prednisone, and duoneb. Prednisone not working because patient still has cold.     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  [name] is a 21 y.o. female presenting for a sick visit.  She was last seen in May 2023.  At  that time, her lung testing looked a bit worse.  We continue with Symbicort 160 mcg 2 puffs twice daily as well as Flovent 110 mcg 2 puffs twice daily and Dupixent every 2 weeks.  We also continued Singulair 10 mg daily.  For her allergic rhinitis, we continue with Singulair as well as levocetirizine.  Since the last visit, everyone has been sick. She had to have dental surgery was  rescheduled because she was not feeling well.   Asthma/Respiratory Symptom History: She has been wheezing a lot over the last few days. She had prednisone called in over Thanksgiving.  She talked to Dr. Marlynn Perking.  She got Augmentin in addition to the prednisone. She did not really clear up with that. She was also doing the DuoNebs. There is a still a lot of the junk still in there. She uses the DuoNeb every two hours even now two weeks afterwards. She has been using a lot of energy to do ADLs. She reports that there is a lot of gunk coming up from her lungs today despite the use of the Augmentin.   She gets the Dupixent every two weeks. Mom receives the shipments from the [facility].   Allergic Rhinitis Symptom History: Rhinitis symptoms are not well-controlled with Singulair and Xyzal.  She has not been on antibiotics at all.  Skin Symptom History: Eczema is better controlled with the use of the Dupixent every other week.  Her spots have all cleared up with the Dupixent. She has not been on antibiotics or systemic steroids for her skin.  Otherwise, there have been no changes to her past medical history, surgical history, family history, or social history.    Review of Systems  Constitutional: Negative.  Negative for chills, fever, malaise/fatigue and weight loss.  HENT:  Positive for congestion. Negative for ear discharge, ear pain and nosebleeds.   Eyes:  Negative for pain, discharge and redness.  Respiratory:  Positive for cough, shortness of breath and wheezing. Negative for sputum production.   Cardiovascular: Negative.  Negative for chest pain and palpitations.  Gastrointestinal:  Negative for abdominal pain, constipation, diarrhea, heartburn, nausea and vomiting.  Skin:  Positive for itching and rash.  Neurological:  Negative for dizziness and headaches.  Endo/Heme/Allergies:  Negative for environmental allergies. Does not bruise/bleed easily.       Objective:   Blood  pressure 102/60, pulse (!) 136, temperature (!) 97.3 F (36.3 C), resp. rate 16, weight 233 lb 3.2 oz (105.8 kg), SpO2 94 %. Body mass index is 39.19 kg/m.    Physical Exam Vitals reviewed.  Constitutional:      Appearance: She is well-developed.     Comments: More difficult for her to talk.   HENT:     Head: Normocephalic and atraumatic.     Right Ear: Tympanic membrane, ear canal and external ear normal.     Left Ear: Tympanic membrane, ear canal and external ear normal.     Nose: No nasal deformity, septal deviation, mucosal edema or rhinorrhea.     Right Turbinates: Enlarged, swollen and pale.     Left Turbinates: Enlarged, swollen and pale.     Right Sinus: No maxillary sinus tenderness or frontal sinus tenderness.     Left Sinus: No maxillary sinus tenderness or frontal sinus tenderness.     Mouth/Throat:     Mouth: Mucous membranes are not pale and not dry.     Pharynx: Uvula midline.  Eyes:     General: Lids  are normal. No allergic shiner.       Right eye: No discharge.        Left eye: No discharge.     Conjunctiva/sclera: Conjunctivae normal.     Right eye: Right conjunctiva is not injected. No chemosis.    Left eye: Left conjunctiva is not injected. No chemosis.    Pupils: Pupils are equal, round, and reactive to light.  Cardiovascular:     Rate and Rhythm: Normal rate and regular rhythm.     Heart sounds: Normal heart sounds.  Pulmonary:     Effort: Pulmonary effort is normal. No tachypnea, accessory muscle usage or respiratory distress.     Breath sounds: Normal breath sounds. No wheezing, rhonchi or rales.     Comments: Moving air well in all lung fields.  There some slight expiratory wheezing at the bases. Thre are coarse upper airway sounds throughout the lung fields.  Chest:     Chest wall: No tenderness.  Lymphadenopathy:     Cervical: No cervical adenopathy.  Skin:    General: Skin is warm.     Capillary Refill: Capillary refill takes less than 2  seconds.     Coloration: Skin is not pale.     Findings: Rash present. No abrasion, erythema or petechiae. Rash is not papular, urticarial or vesicular.     Comments: She does have some eczematous patches behind her ears as well as behind her neck and upper back.  Overall, her face is fairly dry. No crusting or oozing noted. Excoriations over the lesions noted.   Neurological:     Mental Status: She is alert.  Psychiatric:        Behavior: Behavior is cooperative.      Diagnostic studies: none       Malachi Bonds, MD  [facility]

## 2022-11-02 NOTE — Patient Instructions (Addendum)
1. Allergic rhinoconjunctivitis - Continue with the Singulair 10mg  daily.  - Continue with Xyzal (levocetirizine) 5mg  tablet once daily.   2. Eczema - Restarted the Dupient today with a loading dose.  - Continue with clobetasol shampoo twice every other week like you are doing.  3. Severe persistent asthma, uncomplicated - We did not do lung testing since you were sick today. - DepoMedrol injection given today. - This will last for 3-4 days in her system and hopefully start to break this up.  - We are getting a chest X-ray to see whether we are missing something. - We are adding on doxycycline 100mg  twice daily for ten days.  - Daily controller medication(s): Symbicort 160/4.5 two puffs twice daily with spacer + Flovent two puffs twice daily  + Singulair 10mg  daily + Dupixent every 2 weeks - Rescue medications: albuterol 4 puffs every 4-6 hours as needed, albuterol nebulizer one vial puffs every 4-6 hours as needed, or DuoNeb nebulizer one vial every 4-6 hours as needed - Changes during respiratory infections or worsening symptoms: Add on Pulmicort 0.5 mg nebulizer to THREE TREATMENTS three times daily for TWO WEEKS. - Asthma control goals:  * Full participation in all desired activities (may need albuterol before activity) * Albuterol use two time or less a week on average (not counting use with activity) * Cough interfering with sleep two time or less a month * Oral steroids no more than once a year * No hospitalizations  4. GERD - Continue with omeprazole daily.     5. Return in about 6 weeks (around 12/14/2022).    Please inform of any Emergency Department visits, hospitalizations, or changes in symptoms. Call before going to the ED for breathing or allergy symptoms since we might be able to fit you in for a sick visit. Feel free to contact anytime with any questions, problems, or concerns.  It was a pleasure to see you and your family again today!  Websites that  have reliable patient information: 1. American Academy of Asthma, Allergy, and Immunology: www.aaaai.org 2. Food Allergy Research and Education (FARE): foodallergy.org 3. Mothers of Asthmatics: http://www.asthmacommunitynetwork.org 4. American College of Allergy, Asthma, and Immunology: www.acaai.org   COVID-19 Vaccine Information can be found at: 12/16/2022 For questions related to vaccine distribution or appointments, please email [email or web address] or call [phone].   We realize that you might be concerned about having an allergic reaction to the COVID19 vaccines. To help with that concern, WE ARE OFFERING THE COVID19 VACCINES IN OUR OFFICE! Ask the front desk for dates!     "Like" [place] on Facebook and Instagram for our latest updates!      A healthy democracy works best when [place] participate! Make sure you are registered to vote! If you have moved or changed any of your contact information, you will need to get this updated before voting!  In some cases, you MAY be able to register to vote online: [email or web address]

## 2022-11-03 ENCOUNTER — Ambulatory Visit
Admission: RE | Admit: 2022-11-03 | Discharge: 2022-11-03 | Disposition: A | Discharge status: Home / Self Care | Payer: Medicaid Other | Source: Ambulatory Visit | Attending: Allergy & Immunology | Admitting: Allergy & Immunology

## 2022-11-03 ENCOUNTER — Telehealth: Payer: Self-pay | Admitting: Allergy & Immunology

## 2022-11-03 DIAGNOSIS — R0989 Other specified symptoms and signs involving the circulatory and respiratory systems: Secondary | ICD-10-CM

## 2022-11-03 DIAGNOSIS — R0602 Shortness of breath: Secondary | ICD-10-CM

## 2022-11-03 MED ORDER — VENTOLIN HFA 108 (90 BASE) MCG/ACT IN AERS: 2.0000 | INHALATION_SPRAY | RESPIRATORY_TRACT | 1 refills | Status: DC | PRN

## 2022-11-03 MED ORDER — CLOBETASOL PROPIONATE 0.05 % EX SOLN
1.0000 | Freq: Two times a day (BID) | CUTANEOUS | 2 refills | Status: DC
Start: 2022-11-03 — End: 2023-02-02

## 2022-11-03 MED ORDER — MONTELUKAST SODIUM 10 MG PO TABS
10.0000 mg | ORAL_TABLET | Freq: Every day | ORAL | 2 refills | Status: DC
Start: 2022-11-03 — End: 2022-12-14

## 2022-11-03 MED ORDER — DOXYCYCLINE HYCLATE 100 MG PO TABS: 100.0000 mg | ORAL_TABLET | Freq: Two times a day (BID) | ORAL | 0 refills | Status: AC

## 2022-11-03 MED ORDER — SYMBICORT 160-4.5 MCG/ACT IN AERO: 2.0000 | INHALATION_SPRAY | Freq: Two times a day (BID) | RESPIRATORY_TRACT | 2 refills | Status: DC

## 2022-11-03 MED ORDER — FLOVENT HFA 110 MCG/ACT IN AERO
2.0000 | INHALATION_SPRAY | Freq: Two times a day (BID) | RESPIRATORY_TRACT | 2 refills | Status: DC
Start: 2022-11-03 — End: 2023-10-19

## 2022-11-03 MED ORDER — OMEPRAZOLE 20 MG PO CPDR: 20.0000 mg | DELAYED_RELEASE_CAPSULE | Freq: Every day | ORAL | 2 refills | Status: DC

## 2022-11-03 MED ORDER — ALBUTEROL SULFATE (2.5 MG/3ML) 0.083% IN NEBU: 2.5000 mg | INHALATION_SOLUTION | RESPIRATORY_TRACT | 1 refills | Status: DC | PRN

## 2022-11-03 NOTE — Telephone Encounter (Signed)
Sent Doxycycline(Vibra-tabs)

## 2022-11-03 NOTE — Telephone Encounter (Signed)
Pharmacy  [facility]) called stating insurance will not pay for doxycycline (ADOXA) 100 MG tablet [[id number]] needed another option please advise

## 2022-11-04 ENCOUNTER — Telehealth: Payer: Self-pay

## 2022-11-04 ENCOUNTER — Other Ambulatory Visit (HOSPITAL_COMMUNITY): Payer: Self-pay

## 2022-11-04 ENCOUNTER — Encounter: Payer: Self-pay | Admitting: Allergy & Immunology

## 2022-11-04 NOTE — Telephone Encounter (Signed)
Patient Advocate Encounter  Received a fax from [employer] regarding Prior Authorization for Doxycycline Monohydrate 100MG  tablets.   Authorization has been DENIED.  Determination letter will be sent to patient  Submitted: 11-04-2022 Confirmation #: 11-06-2022 [id number]

## 2022-11-04 NOTE — Telephone Encounter (Addendum)
Per provider - Called patient - spoke to mother, [name] - DOB verified, no DPR on file - advised of CXR results. Mother states patient is doing pretty good - Prednisone is helping - patient is heading in the right direction.  Forwarding message to provider as update.  ----- Message from Alfonse Spruce, MD sent at 11/04/2022  5:05 AM EST ----- Can someone call the family to let them know that a CXR was normal? Please ask how she is feeling.

## 2022-11-04 NOTE — Telephone Encounter (Signed)
Thanks much!  Semisi Biela, MD [facility]  

## 2022-11-04 NOTE — Telephone Encounter (Signed)
There has to be a doxycycline form that is ok to prescribe. Please send that in.

## 2022-11-04 NOTE — Telephone Encounter (Signed)
Pa for doxy was denied please advise to change in therapy

## 2022-11-05 ENCOUNTER — Other Ambulatory Visit (HOSPITAL_COMMUNITY): Payer: Self-pay

## 2022-11-05 MED ORDER — DOXYCYCLINE MONOHYDRATE 100 MG PO TABS: 100.0000 mg | ORAL_TABLET | Freq: Two times a day (BID) | ORAL | 0 refills | Status: DC

## 2022-11-05 NOTE — Telephone Encounter (Signed)
Please see pic below

## 2022-11-05 NOTE — Telephone Encounter (Signed)
Sent in medication to pts pharmacy

## 2022-11-05 NOTE — Telephone Encounter (Signed)
Doxycycline monohydrate is fine - 100mg  BID.   She needed an antibiotic days ago.   , MD [facility]

## 2022-11-05 NOTE — Telephone Encounter (Signed)
Current preferred medications in this class on medicaid formulary.

## 2022-11-10 ENCOUNTER — Other Ambulatory Visit (INDEPENDENT_AMBULATORY_CARE_PROVIDER_SITE_OTHER): Payer: Self-pay | Admitting: Neurology

## 2022-11-10 DIAGNOSIS — R252 Cramp and spasm: Secondary | ICD-10-CM

## 2022-11-10 DIAGNOSIS — G931 Anoxic brain damage, not elsewhere classified: Secondary | ICD-10-CM

## 2022-11-10 DIAGNOSIS — G252 Other specified forms of tremor: Secondary | ICD-10-CM

## 2022-11-10 MED ORDER — CLONAZEPAM 0.5 MG PO TABS: 1.0000 mg | ORAL_TABLET | Freq: Two times a day (BID) | ORAL | 5 refills | Status: DC

## 2022-11-10 NOTE — Telephone Encounter (Signed)
Last OV: 04-09-2022  Next OV: 12-14-2022  Last Written:  04-09-2022  I called Mom [facility]) to let her know that no Rx request was received). Also discussed having her check with [place] and pharmacy if no response to check the status so she doesn't run out.  Rx request forwarded to Dr. Tommi Rumps CMA

## 2022-11-10 NOTE — Telephone Encounter (Signed)
  Name of who is calling:[name]   Caller's Relationship to Patient:Mother   Best contact number:[phone]   Provider they see:Dr. NAB   Reason for call:Medication refill. Mom stated that she has been without medication over week and the pharmacy stated they sent a request for it and when she went to get it filled they told her to contact the office. Mom stated that she has now started having tremors and can't even walk to the bathroom. Please advise      PRESCRIPTION REFILL ONLY  Name of prescription:Clonazepam   Pharmacy:[facility]

## 2022-11-12 ENCOUNTER — Other Ambulatory Visit: Payer: Self-pay

## 2022-11-21 ENCOUNTER — Other Ambulatory Visit (HOSPITAL_COMMUNITY): Payer: Self-pay

## 2022-12-06 ENCOUNTER — Other Ambulatory Visit: Payer: Self-pay

## 2022-12-10 ENCOUNTER — Ambulatory Visit (INDEPENDENT_AMBULATORY_CARE_PROVIDER_SITE_OTHER): Payer: Medicaid Other | Admitting: Neurology

## 2022-12-10 NOTE — Progress Notes (Signed)
Patient: [name] MRN: [id number] Sex: female DOB: Feb 25, 2001  Provider: Keturah Shavers, MD Location of Care: [facility]  Note type: Routine return visit  Referral Source: PCP, Pediatrician.  History from:  Mom  Chief Complaint: Anoxic brain injury, Followup  History of Present Illness: [name] is a 22 y.o. female is here for follow-up management of anoxic brain injury with spasticity, quadriparesis, difficulty with ambulation, speech impairment and sleep difficulty. She has been on multiple medications including Keppra with a possible seizure at some point but not over the past few years. She was initially seen by Dr. Sharene Skeans and was seen by myself in May 2023 and at that time recommended to continue similar doses of medications which has been helping her with spasticity, pain, sleep and possible seizure. She has been on baclofen, clonazepam and gabapentin to help with muscle spasms and pain.  She has been on clonidine and trazodone to help with sleep through the night. As per mother she has been doing fairly well over the past few months although she is still having some difficulty sleeping at night even though she is taking medication and also she is having some back pain without any specific etiology.  She has been doing fairly well in terms of spasticity and muscle spasms and mother has no other complaints or concerns at this time. She has not had any episodes of abnormal involuntary movements concerning for seizure activity although she is having occasional tremors.  She is able to walk the same as before but overall she is not very active physically and does not walk regularly throughout the day.   Review of Systems: Review of system as per HPI, otherwise negative.  Past Medical History:  Diagnosis Date   Allergy    Allergy    Multiple allergies - see allergy list   Alopecia    Asthma    Asthma    Family history of adverse reaction to anesthesia     Mom - Difficult to anesthesize/Hypotension   Headache    With wheezing per Mom   Obesity    Steroid induced per Mom   Pyelonephritis 07/12/2019   Vision abnormalities    Diminished vision Left Eye per Mom   Hospitalizations: No., Head Injury: No., Nervous System Infections: No., Immunizations up to date: Yes.     Surgical History Past Surgical History:  Procedure Laterality Date   ADENOIDECTOMY     ESOPHAGOGASTRODUODENOSCOPY (EGD) WITH PROPOFOL N/A 09/27/2017   Procedure: ESOPHAGOGASTRODUODENOSCOPY (EGD) WITH PROPOFOL;  Surgeon: Adelene Amas, MD;  Location: [facility] ENDOSCOPY;  Service: Gastroenterology;  Laterality: N/A;   PEG PLACEMENT N/A 09/27/2017   Procedure: PERCUTANEOUS ENDOSCOPIC GASTROSTOMY (PEG) PLACEMENT;  Surgeon: Adelene Amas, MD;  Location: [facility] ENDOSCOPY;  Service: Gastroenterology;  Laterality: N/A;   TONSILLECTOMY     WISDOM TOOTH EXTRACTION      Family History family history includes Asthma in her mother and paternal grandmother; Cancer in her maternal grandfather, mother, and paternal grandmother; Depression in her brother; Diabetes in her maternal grandfather and maternal grandmother; Hypertension in her maternal grandfather and maternal grandmother; Vision loss in her mother.   Social History Social History   Socioeconomic History   Marital status: Single    Spouse name: Not on file   Number of children: Not on file   Years of education: Not on file   Highest education level: Not on file  Occupational History   Not on file  Tobacco Use   Smoking status:  Never   Smokeless tobacco: Never   Tobacco comments:    no smokers in the home  Vaping Use   Vaping Use: Never used  Substance and Sexual Activity   Alcohol use: No   Drug use: No   Sexual activity: Never    Comment: Metformin for irregular menstrual cycles  Other Topics Concern   Not on file  Social History Narrative   [name] is currently not in school   Patient lives at home with mom, dad, and  brother.    Social Determinants of Health   Financial Resource Strain: Not on file  Food Insecurity: Not on file  Transportation Needs: Not on file  Physical Activity: Not on file  Stress: Not on file  Social Connections: Not on file     Allergies  Allergen Reactions   Azithromycin Anaphylaxis   Biaxin [Clarithromycin] Anaphylaxis   Erythromycin Anaphylaxis   Macrolides And Ketolides Anaphylaxis   Nitrofurantoin Monohyd Macro Anaphylaxis   Quinolones Anaphylaxis   Troleandomycin Anaphylaxis   Mango Flavor Hives    Physical Exam BP 106/68   Pulse 84   Ht 5\' 5"  (1.651 m)   Wt 244 lb 6.4 oz (110.9 kg)   LMP 12/06/2022 (Approximate)   BMI 40.67 kg/m  Gen: Awake, alert, not in distress, Non-toxic appearance. Skin: No neurocutaneous stigmata, no rash HEENT: Normocephalic, no dysmorphic features, no conjunctival injection, nares patent, mucous membranes moist, oropharynx clear. Neck: Supple, no meningismus, no lymphadenopathy,  Resp: Clear to auscultation bilaterally CV: Regular rate, normal S1/S2, no murmurs, no rubs Abd: Bowel sounds present, abdomen soft, non-tender, non-distended.  No hepatosplenomegaly or mass. Ext: Warm and well-perfused. No deformity, no muscle wasting, ROM full.  Neurological Examination: MS- Awake, alert, interactive Cranial Nerves- Pupils equal, round and reactive to light (5 to 2mm); fix and follows with full and smooth EOM; no nystagmus; no ptosis, funduscopy with normal sharp discs, visual field full by looking at the toys on the side, face symmetric with smile.  Hearing intact to bell bilaterally, palate elevation is symmetric,  Tone- Normal Strength-Seems to have good strength, symmetrically by observation and passive movement. Reflexes-    Biceps Triceps Brachioradialis Patellar Ankle  R 2+ 2+ 2+ 2+ 2+  L 2+ 2+ 2+ 2+ 2+   Plantar responses flexor bilaterally, no clonus noted Sensation- Withdraw at four limbs to stimuli. Coordination-  Reached to the object with no dysmetria Gait: Able to stand on her feet independently and able to walk with walker   Assessment and Plan 1. Anoxic brain injury ([place])   2. Intention tremor   3. Spasticity   4. Neurologic gait disorder   5. Action induced myoclonus   6. Poor sleep hygiene   7. 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  She has not had any frank seizure activity over the past few years.  She is still not sleeping well through the night.  She has no new findings on her neurological examination. Recommend to decrease the dose of Keppra to 250 mg twice daily for now since she is not having any seizure activity and we will schedule for EEG in a month or so and if her EEG is normal then we may discontinue Keppra at her next visit. Since trazodone is not helping with sleep, we will discontinue trazodone and may slightly increase  the dose of clonidine to 0.2 mg every night and she will continue the same dose of 0.1 mg every morning. She will continue the same dose of gabapentin, clonazepam and baclofen. She needs to have more physical activity with walking around on a daily basis which is helping her significantly with several different things including not gaining weight, more ambulation which may help with the spasms and back pain. She will continue follow-up with PCP and pulmonologist I would like to see her in 6 months for follow-up visit.  She and her mother understood and agreed with the plan. I spent 40 minutes with patient and her mother, more than 50% time spent for counseling and coordination of care.   Meds ordered this encounter  Medications   gabapentin (NEURONTIN) 300 MG capsule    Sig: Take 1 capsule (300 mg total) by mouth 3 (three) times daily.    Dispense:  90 capsule    Refill:  7    cloNIDine (CATAPRES) 0.1 MG tablet    Sig: Take 1 tablet in a.m. and 2 tablets in p.m.    Dispense:  90 tablet    Refill:  6   clonazePAM (KLONOPIN) 0.5 MG tablet    Sig: Take 2 tablets 2 times a day    Dispense:  124 tablet    Refill:  5   baclofen (LIORESAL) 20 MG tablet    Sig: Take 1 tablet (20 mg total) by mouth 3 (three) times daily.    Dispense:  90 tablet    Refill:  7   levETIRAcetam (KEPPRA) 250 MG tablet    Sig: Take 1 tablet (250 mg total) by mouth 2 (two) times daily.    Dispense:  60 tablet    Refill:  6   Orders Placed This Encounter  Procedures   EEG Child    Standing Status:   Future    Standing Expiration Date:   12/15/2023    Scheduling Instructions:     To be done in 1 month    Order Specific Question:   Where should this test be performed?    Answer:   [facility]    Order Specific Question:   Reason for exam    Answer:   Seizure

## 2022-12-14 ENCOUNTER — Encounter (INDEPENDENT_AMBULATORY_CARE_PROVIDER_SITE_OTHER): Payer: Self-pay | Admitting: Neurology

## 2022-12-14 ENCOUNTER — Ambulatory Visit (INDEPENDENT_AMBULATORY_CARE_PROVIDER_SITE_OTHER): Payer: Medicaid Other | Admitting: Allergy & Immunology

## 2022-12-14 ENCOUNTER — Ambulatory Visit (INDEPENDENT_AMBULATORY_CARE_PROVIDER_SITE_OTHER): Payer: Medicaid Other | Admitting: Neurology

## 2022-12-14 ENCOUNTER — Encounter: Payer: Self-pay | Admitting: Allergy & Immunology

## 2022-12-14 ENCOUNTER — Other Ambulatory Visit: Payer: Self-pay

## 2022-12-14 VITALS — BP 106/68 | HR 84 | Ht 65.0 in | Wt 244.4 lb

## 2022-12-14 VITALS — BP 98/66 | HR 79 | Temp 98.3°F | Resp 16

## 2022-12-14 DIAGNOSIS — Z72821 Inadequate sleep hygiene: Secondary | ICD-10-CM

## 2022-12-14 DIAGNOSIS — G253 Myoclonus: Secondary | ICD-10-CM

## 2022-12-14 DIAGNOSIS — K219 Gastro-esophageal reflux disease without esophagitis: Secondary | ICD-10-CM

## 2022-12-14 DIAGNOSIS — G252 Other specified forms of tremor: Secondary | ICD-10-CM | POA: Diagnosis not present

## 2022-12-14 DIAGNOSIS — R252 Cramp and spasm: Secondary | ICD-10-CM

## 2022-12-14 DIAGNOSIS — R269 Unspecified abnormalities of gait and mobility: Secondary | ICD-10-CM | POA: Diagnosis not present

## 2022-12-14 DIAGNOSIS — J3089 Other allergic rhinitis: Secondary | ICD-10-CM

## 2022-12-14 DIAGNOSIS — G931 Anoxic brain damage, not elsewhere classified: Secondary | ICD-10-CM

## 2022-12-14 DIAGNOSIS — J302 Other seasonal allergic rhinitis: Secondary | ICD-10-CM

## 2022-12-14 DIAGNOSIS — J4550 Severe persistent asthma, uncomplicated: Secondary | ICD-10-CM

## 2022-12-14 DIAGNOSIS — M62830 Muscle spasm of back: Secondary | ICD-10-CM

## 2022-12-14 DIAGNOSIS — L2089 Other atopic dermatitis: Secondary | ICD-10-CM

## 2022-12-14 MED ORDER — FAMOTIDINE 40 MG PO TABS: 40.0000 mg | ORAL_TABLET | Freq: Every day | ORAL | 2 refills | Status: DC

## 2022-12-14 MED ORDER — OMEPRAZOLE 40 MG PO CPDR: 40.0000 mg | DELAYED_RELEASE_CAPSULE | Freq: Every day | ORAL | 2 refills | Status: DC

## 2022-12-14 MED ORDER — PREDNISONE 10 MG PO TABS: ORAL_TABLET | ORAL | 0 refills | Status: DC

## 2022-12-14 MED ORDER — MONTELUKAST SODIUM 10 MG PO TABS: 10.0000 mg | ORAL_TABLET | Freq: Every day | ORAL | 1 refills | Status: DC

## 2022-12-14 MED ORDER — CLONIDINE HCL 0.1 MG PO TABS: ORAL_TABLET | ORAL | 6 refills | Status: DC

## 2022-12-14 MED ORDER — EPIPEN 2-PAK 0.3 MG/0.3ML IJ SOAJ: 0.3000 mg | INTRAMUSCULAR | 1 refills | Status: DC | PRN

## 2022-12-14 MED ORDER — GABAPENTIN 300 MG PO CAPS: 300.0000 mg | ORAL_CAPSULE | Freq: Three times a day (TID) | ORAL | 7 refills | Status: DC

## 2022-12-14 MED ORDER — CLONAZEPAM 0.5 MG PO TABS: ORAL_TABLET | ORAL | 5 refills | Status: DC

## 2022-12-14 MED ORDER — LEVETIRACETAM 250 MG PO TABS: 250.0000 mg | ORAL_TABLET | Freq: Two times a day (BID) | ORAL | 6 refills | Status: DC

## 2022-12-14 MED ORDER — BACLOFEN 20 MG PO TABS: 20.0000 mg | ORAL_TABLET | Freq: Three times a day (TID) | ORAL | 7 refills | Status: DC

## 2022-12-14 MED ORDER — SYMBICORT 160-4.5 MCG/ACT IN AERO: 2.0000 | INHALATION_SPRAY | Freq: Two times a day (BID) | RESPIRATORY_TRACT | 1 refills | Status: DC

## 2022-12-14 NOTE — Progress Notes (Signed)
FOLLOW UP  Date of Service/Encounter:  12/14/22   Assessment:   Eczema - now better controlled on Dupixent    Severe persistent asthma - now back on Dupixent   Seasonal and perennial allergies rhinitis    Complicated history, including a prolonged hospitalization following respiratory arrest in the field (Oct 2018)   Multiple complications secondary to recurrent prednisone courses, including bone abnormalities   History of non-compliance - improved  Plan/Recommendations:    1. Allergic rhinoconjunctivitis - Continue with the Singulair 10mg  daily.  - Continue with Xyzal (levocetirizine) 5mg  tablet once daily.   2. Eczema - Continue with Dupixent as you are doing.  - Continue with clobetasol shampoo twice every other week like you are doing.  3. Severe persistent asthma, uncomplicated - We did not do lung testing since you were sick recently. - We will get one next time you come in.  - Daily controller medication(s): Symbicort 160/4.5 two puffs twice daily with spacer + Flovent 163mcg two puffs twice daily  + Singulair 10mg  daily + Dupixent every 2 weeks - Rescue medications: albuterol 4 puffs every 4-6 hours as needed, albuterol nebulizer one vial puffs every 4-6 hours as needed, or DuoNeb nebulizer one vial every 4-6 hours as needed - Changes during respiratory infections or worsening symptoms: Add on Pulmicort 0.5 mg nebulizer to THREE TREATMENTS three times daily for TWO WEEKS. - Asthma control goals:  * Full participation in all desired activities (may need albuterol before activity) * Albuterol use two time or less a week on average (not counting use with activity) * Cough interfering with sleep two time or less a month * Oral steroids no more than once a year * No hospitalizations  4. GERD - Continue with omeprazole but increase to the 40mg  in the morning.     - Add on famotidine 40mg  at night to help with this reflux.   5. Return in about 3 months (around  03/15/2023).   Subjective:   [name] is a 22 y.o. 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  [name] is a 22 y.o. female presenting for a follow up visit.  She was last seen in December 2023.  At that time, we continue with Singulair as well as Xyzal.  We restarted her Dupixent at that visit and continue with clobetasol shampoo.  We did not do any lung testing.  We gave her a Depo-Medrol injection.  We are to continue with Symbicort 160 mcg 2 puffs twice daily and Flovent 110 mcg 2 puffs  twice daily.  We also continue with Singulair and Dupixent every 2 weeks.  For the reflux, we continue with omeprazole.  Since last visit, she has not done well. She had a stomach flu last week. She did not get an antiviral. She was having a lot of emesis. She had bad acid reflux during this time. But during this time, she did not need prednisone for her breathing at least.   Asthma/Respiratory Symptom History: She remains on the  Dupixent.  She is getting these routinely at home. Mom administers them. She remains on the Symbicort as well as the Flovent. She takes two puffs of each of these BID. She is also on the Singulair 10mg  daily She has nto needed prednisone at all since the last visit. She has not been to the ED at all.   Allergic Rhinitis Symptom History: Her allergic rhinitis has been controlled with the levocetirizine as well as the montelukast. She continues to have a marked amount of nasal congestion. She has not been on antibiotics at all since the last visit.   Skin Symptom History: Skin is under much better control with the Dupixent every two weeks.  She had a lesion on the back of her neck that has gotten much better since starting the Dupixent once again.   GERD Symptom History: She is on omeprazole 20mg  daily. She is having a lot of reflux on this nonetheless. She has not been on famotidine in the past in combination with her omeprazole.   Otherwise, there have been no changes to her past medical history, surgical history, family history, or social history.    Review of Systems  Constitutional: Negative.  Negative for chills, fever, malaise/fatigue and weight loss.  HENT:  Positive for congestion. Negative for ear discharge, ear pain and nosebleeds.   Eyes:  Negative for pain, discharge and redness.  Respiratory:  Negative for cough, sputum production, shortness of breath and wheezing.   Cardiovascular: Negative.  Negative for chest pain and palpitations.  Gastrointestinal:  Negative for abdominal pain, constipation, diarrhea, heartburn, nausea and vomiting.  Skin:  Negative for itching and rash.  Neurological:  Negative for dizziness and headaches.  Endo/Heme/Allergies:  Negative for environmental allergies. Does not bruise/bleed easily.       Objective:   Blood pressure 98/66, pulse 79, temperature 98.3 F (36.8 C), temperature source Oral, resp. rate 16, last menstrual period 12/06/2022, SpO2  98 %. There is no height or weight on file to calculate BMI.    Physical Exam Vitals reviewed.  Constitutional:      Appearance: She is well-developed.  HENT:     Head: Normocephalic and atraumatic.     Right Ear: Tympanic membrane, ear canal and external ear normal.     Left Ear: Tympanic membrane, ear canal and external ear normal.     Nose: No nasal deformity, septal deviation, mucosal edema or rhinorrhea.     Right Turbinates: Enlarged, swollen and pale.     Left Turbinates: Enlarged, swollen and pale.     Right Sinus: No maxillary sinus tenderness or frontal sinus tenderness.     Left Sinus: No maxillary sinus tenderness or frontal sinus tenderness.     Mouth/Throat:     Mouth: Mucous membranes are not pale and not dry.     Pharynx: Uvula midline.  Eyes:     General: Lids are normal. No allergic shiner.       Right eye: No discharge.  Left eye: No discharge.     Conjunctiva/sclera: Conjunctivae normal.     Right eye: Right conjunctiva is not injected. No chemosis.    Left eye: Left conjunctiva is not injected. No chemosis.    Pupils: Pupils are equal, round, and reactive to light.  Cardiovascular:     Rate and Rhythm: Normal rate and regular rhythm.     Heart sounds: Normal heart sounds.  Pulmonary:     Effort: Pulmonary effort is normal. No tachypnea, accessory muscle usage or respiratory distress.     Breath sounds: Normal breath sounds. No wheezing, rhonchi or rales.     Comments: Moving air well in all lung fields.  There some slight expiratory wheezing at the bases. Thre are coarse upper airway sounds throughout the lung fields.  Chest:     Chest wall: No tenderness.  Lymphadenopathy:     Cervical: No cervical adenopathy.  Skin:    General: Skin is warm.     Capillary Refill: Capillary refill takes less than 2 seconds.     Coloration: Skin is not pale.     Findings: Rash present. No abrasion, erythema or petechiae. Rash is not papular, urticarial or  vesicular.     Comments: She does have some eczematous patches behind her ears as well as behind her neck and upper back.  Overall, her face is fairly dry. No crusting or oozing noted. Excoriations over the lesions noted.   Neurological:     Mental Status: She is alert.  Psychiatric:        Behavior: Behavior is cooperative.      Diagnostic studies: none      Salvatore Marvel, MD  [facility]

## 2022-12-14 NOTE — Patient Instructions (Addendum)
1. Allergic rhinoconjunctivitis - Continue with the Singulair 10mg  daily.  - Continue with Xyzal (levocetirizine) 5mg  tablet once daily.   2. Eczema - Continue with Dupixent as you are doing.  - Continue with clobetasol shampoo twice every other week like you are doing.  3. Severe persistent asthma, uncomplicated - We did not do lung testing since you were sick recently. - We will get one next time you come in.  - Daily controller medication(s): Symbicort 160/4.5 two puffs twice daily with spacer + Flovent 132mcg two puffs twice daily  + Singulair 10mg  daily + Dupixent every 2 weeks - Rescue medications: albuterol 4 puffs every 4-6 hours as needed, albuterol nebulizer one vial puffs every 4-6 hours as needed, or DuoNeb nebulizer one vial every 4-6 hours as needed - Changes during respiratory infections or worsening symptoms: Add on Pulmicort 0.5 mg nebulizer to THREE TREATMENTS three times daily for TWO WEEKS. - Asthma control goals:  * Full participation in all desired activities (may need albuterol before activity) * Albuterol use two time or less a week on average (not counting use with activity) * Cough interfering with sleep two time or less a month * Oral steroids no more than once a year * No hospitalizations  4. GERD - Continue with omeprazole but increase to the 40mg  in the morning.     - Add on famotidine 40mg  at night to help with this reflux.   5. Return in about 3 months (around 03/15/2023).    Please inform us of any Emergency Department visits, hospitalizations, or changes in symptoms. Call us before going to the ED for breathing or allergy symptoms since we might be able to fit you in for a sick visit. Feel free to contact us anytime with any questions, problems, or concerns.  It was a pleasure to see you and your family again today!  Websites that have reliable patient information: 1. American Academy of Asthma, Allergy, and Immunology: www.aaaai.org 2. Food Allergy  Research and Education (FARE): foodallergy.org 3. Mothers of Asthmatics: http://www.asthmacommunitynetwork.org 4. American College of Allergy, Asthma, and Immunology: www.acaai.org   COVID-19 Vaccine Information can be found at: [email or web address].[email or web address].uk For questions related to vaccine distribution or appointments, please email [email or web address] or call [phone].   We realize that you might be concerned about having an allergic reaction to the COVID19 vaccines. To help with that concern, WE ARE OFFERING THE COVID19 VACCINES IN OUR OFFICE! Ask the front desk for dates!     "Like" [place] on Facebook and Instagram for our latest updates!      A healthy democracy works best when [employer] participate! Make sure you are registered to vote! If you have moved or changed any of your contact information, you will need to get this updated before voting!  In some cases, you MAY be able to register to vote online: [email or web address]

## 2022-12-14 NOTE — Patient Instructions (Signed)
We will decrease the dose of Keppra to 250 mg twice daily We will schedule for EEG to evaluate for any abnormal discharges We will discontinue trazodone since it is not working We will increase the dose of clonidine to 0.2 mg at night and 0.1 mg in the morning We will continue the same dose of other medications Continue follow-up with PCP and asthma specialist Continue with regular exercise and physical activity on a daily basis Sleep at a specific time every night with no electronic at bedtime Return in 6 months for follow-up with

## 2022-12-28 ENCOUNTER — Other Ambulatory Visit: Payer: Self-pay | Admitting: Allergy & Immunology

## 2022-12-28 ENCOUNTER — Other Ambulatory Visit (HOSPITAL_COMMUNITY): Payer: Self-pay

## 2022-12-30 ENCOUNTER — Other Ambulatory Visit (HOSPITAL_COMMUNITY): Payer: Self-pay

## 2022-12-31 ENCOUNTER — Other Ambulatory Visit: Payer: Self-pay | Admitting: Allergy & Immunology

## 2023-01-03 ENCOUNTER — Other Ambulatory Visit (HOSPITAL_COMMUNITY): Payer: Self-pay

## 2023-01-07 ENCOUNTER — Other Ambulatory Visit (HOSPITAL_COMMUNITY): Payer: Self-pay

## 2023-01-13 ENCOUNTER — Other Ambulatory Visit (HOSPITAL_COMMUNITY): Payer: Self-pay

## 2023-02-01 ENCOUNTER — Other Ambulatory Visit: Payer: Self-pay | Admitting: Allergy & Immunology

## 2023-02-02 ENCOUNTER — Other Ambulatory Visit: Payer: Self-pay | Admitting: *Deleted

## 2023-02-02 MED ORDER — CLOBETASOL PROPIONATE 0.05 % EX SOLN: 1.0000 | Freq: Two times a day (BID) | CUTANEOUS | 2 refills | Status: DC

## 2023-02-03 ENCOUNTER — Other Ambulatory Visit (HOSPITAL_COMMUNITY): Payer: Self-pay

## 2023-02-04 ENCOUNTER — Other Ambulatory Visit: Payer: Self-pay

## 2023-02-10 ENCOUNTER — Telehealth (INDEPENDENT_AMBULATORY_CARE_PROVIDER_SITE_OTHER): Payer: Self-pay | Admitting: Neurology

## 2023-02-10 NOTE — Telephone Encounter (Signed)
Called mother she reports that she did not realize he discontinued it thought it was decreased RN read his note to her. She reports that she just wanted to get all meds refilled so she can change pharm

## 2023-02-10 NOTE — Telephone Encounter (Signed)
Notified pharmacy staff this was d/c by provider on 12-14-2022.  B. Roten CMA

## 2023-02-10 NOTE — Telephone Encounter (Signed)
Call to mom to advise Rx's for Baclofen, Clonazepam, Clonidine, Gabapentin, Keppra were sent to [facility] on 12/14/2022 with 5-7 rf on each. She reports pharm says they did not receive them and have requested them several times but we don't receive them.   Call to pharm- they reported they have three of them- RN advised all were sent on the same day- they then report they do have all of them but the [place] is the only one that is eligible for a refill. RN advised mom reports she is completely out of the Clonazepam so that is not possible. RN advised she will have mom call and they can discuss where her medication is.  Call back to mom advised as above. She reports they only had enough for 3 wk supply and were to dispense the other week but deny that. She does not have the receipt but she had to sign for the meds. RN advised her to contact the pharm. If she wants to change pharmacies the rx's can be resent but if they billed for the meds then insurance will not cover them. She reports they put them in the blister packs so there is no way they took more than they were prescribed. She will contact pharm and let our office know if she wants to change pharmacies. Mom appreciates the help

## 2023-02-10 NOTE — Telephone Encounter (Signed)
  Name of who is calling:  [name]  Caller's Relationship to Patient: Mom  Best contact number: [phone]  Provider they see: Dr.Nab  Reason for call: Mom called and stated that Brendi need a refill on prescription. Pharmacy has tried reaching out several times and haven't received a responds. Its for Clonazepam and another one she was unsure of the name. Mom would like someone to call once refill has ben sent. She is completely out!!!     [place]  Name of prescription: Clonazepam  Pharmacy: [facility]

## 2023-02-10 NOTE — Telephone Encounter (Signed)
Who's calling (name and relationship to patient) : [place] contact number: [phone]  Provider they see: Dr. Secundino Ginger  Reason for call: Gabriel Cirri called to request Rx refill for Trazadone 50     PRESCRIPTION REFILL ONLY  Name of prescription:  Pharmacy:

## 2023-02-21 ENCOUNTER — Other Ambulatory Visit: Payer: Self-pay | Admitting: Allergy & Immunology

## 2023-03-02 ENCOUNTER — Other Ambulatory Visit (HOSPITAL_COMMUNITY): Payer: Self-pay

## 2023-03-14 ENCOUNTER — Other Ambulatory Visit: Payer: Self-pay

## 2023-03-15 ENCOUNTER — Ambulatory Visit: Payer: Medicaid Other | Admitting: Allergy & Immunology

## 2023-03-21 ENCOUNTER — Other Ambulatory Visit: Payer: Self-pay | Admitting: Allergy & Immunology

## 2023-04-12 ENCOUNTER — Other Ambulatory Visit (HOSPITAL_COMMUNITY): Payer: Self-pay

## 2023-04-12 ENCOUNTER — Ambulatory Visit: Payer: Medicaid Other | Admitting: Allergy & Immunology

## 2023-04-19 ENCOUNTER — Other Ambulatory Visit (HOSPITAL_COMMUNITY): Payer: Self-pay

## 2023-04-19 ENCOUNTER — Other Ambulatory Visit: Payer: Self-pay | Admitting: Allergy & Immunology

## 2023-04-21 ENCOUNTER — Other Ambulatory Visit: Payer: Self-pay

## 2023-04-21 ENCOUNTER — Other Ambulatory Visit (HOSPITAL_COMMUNITY): Payer: Self-pay

## 2023-05-13 ENCOUNTER — Other Ambulatory Visit (HOSPITAL_COMMUNITY): Payer: Self-pay

## 2023-05-16 ENCOUNTER — Other Ambulatory Visit: Payer: Self-pay | Admitting: Allergy & Immunology

## 2023-05-18 ENCOUNTER — Other Ambulatory Visit (HOSPITAL_COMMUNITY): Payer: Self-pay

## 2023-05-24 ENCOUNTER — Encounter: Payer: Self-pay | Admitting: Allergy & Immunology

## 2023-05-24 ENCOUNTER — Other Ambulatory Visit: Payer: Self-pay

## 2023-05-24 ENCOUNTER — Ambulatory Visit (INDEPENDENT_AMBULATORY_CARE_PROVIDER_SITE_OTHER): Payer: Medicaid Other | Admitting: Allergy & Immunology

## 2023-05-24 VITALS — BP 112/80 | HR 110 | Temp 97.8°F | Resp 18

## 2023-05-24 DIAGNOSIS — K219 Gastro-esophageal reflux disease without esophagitis: Secondary | ICD-10-CM

## 2023-05-24 DIAGNOSIS — L2089 Other atopic dermatitis: Secondary | ICD-10-CM | POA: Diagnosis not present

## 2023-05-24 DIAGNOSIS — J4550 Severe persistent asthma, uncomplicated: Secondary | ICD-10-CM

## 2023-05-24 DIAGNOSIS — J3089 Other allergic rhinitis: Secondary | ICD-10-CM

## 2023-05-24 DIAGNOSIS — H538 Other visual disturbances: Secondary | ICD-10-CM | POA: Diagnosis not present

## 2023-05-24 DIAGNOSIS — J302 Other seasonal allergic rhinitis: Secondary | ICD-10-CM

## 2023-05-24 MED ORDER — PREDNISONE 10 MG PO TABS: ORAL_TABLET | ORAL | 0 refills | Status: DC

## 2023-05-24 NOTE — Patient Instructions (Addendum)
1. Allergic rhinoconjunctivitis - Continue with the Singulair 10mg  daily.  - Continue with Xyzal (levocetirizine) 5mg  tablet once daily.   2. Eczema - Continue with Dupixent as you are doing.  - Continue with clobetasol shampoo twice every other week like you are doing.  3. Severe persistent asthma, uncomplicated - Lung testing looks fairly good today.  - I did send in a prednisone burst in case you need to use it.  - We did fill out a handicap parking tag for her today. - Call the [facility] number to see how to get this Aquatic therapy started: [phone]  - Daily controller medication(s): Symbicort 160/4.5 two puffs twice daily with spacer + Flovent two puffs twice daily  + Singulair 10mg  daily + Dupixent every 2 weeks - Rescue medications: albuterol 4 puffs every 4-6 hours as needed, albuterol nebulizer one vial puffs every 4-6 hours as needed, or DuoNeb nebulizer one vial every 4-6 hours as needed - Changes during respiratory infections or worsening symptoms: Add on Pulmicort 0.5 mg nebulizer to THREE TREATMENTS three times daily for TWO WEEKS. - Asthma control goals:  * Full participation in all desired activities (may need albuterol before activity) * Albuterol use two time or less a week on average (not counting use with activity) * Cough interfering with sleep two time or less a month * Oral steroids no more than once a year * No hospitalizations  4. GERD - Continue with omeprazole but increase to the 40mg  in the morning.      5. Blurry vision - We are going to refer her to Ophthalmology.   6. Return in about 6 months (around 11/24/2023).    Please inform us of any Emergency Department visits, hospitalizations, or changes in symptoms. Call us before going to the ED for breathing or allergy symptoms since we might be able to fit you in for a sick visit. Feel free to contact us anytime with any questions, problems, or concerns.  It was a pleasure to see  you and your family again today!  Websites that have reliable patient information: 1. American Academy of Asthma, Allergy, and Immunology: www.aaaai.org 2. Food Allergy Research and Education (FARE): foodallergy.org 3. Mothers of Asthmatics: http://www.asthmacommunitynetwork.org 4. American College of Allergy, Asthma, and Immunology: www.acaai.org   COVID-19 Vaccine Information can be found at: [email or web address].[email or web address] For questions related to vaccine distribution or appointments, please email [email or web address] or call [phone].   We realize that you might be concerned about having an allergic reaction to the COVID19 vaccines. To help with that concern, WE ARE OFFERING THE COVID19 VACCINES IN OUR OFFICE! Ask the front desk for dates!     "Like" [place] on Facebook and Instagram for our latest updates!      A healthy democracy works best when [employer] participate! Make sure you are registered to vote! If you have moved or changed any of your contact information, you will need to get this updated before voting!  In some cases, you MAY be able to register to vote online: [email or web address]

## 2023-05-24 NOTE — Progress Notes (Unsigned)
FOLLOW UP  Date of Service/Encounter:  05/24/23   Assessment:   Eczema - now better controlled on Dupixent    Severe persistent asthma - now back on Dupixent   Seasonal and perennial allergies rhinitis    Complicated history, including a prolonged hospitalization following respiratory arrest in the field (Oct 2018)   Multiple complications secondary to recurrent prednisone courses, including bone abnormalities   History of non-compliance - improved  Plan/Recommendations:   1. Allergic rhinoconjunctivitis - Continue with the Singulair 10mg  daily.  - Continue with Xyzal (levocetirizine) 5mg  tablet once daily.   2. Eczema - Continue with Dupixent as you are doing.  - Continue with clobetasol shampoo twice every other week like you are doing.  3. Severe persistent asthma, uncomplicated - Lung testing looks fairly good today.  - I did send in a prednisone burst in case you need to use it.  - We did fill out a handicap parking tag for her today. - Call the [facility] number to see how to get this Aquatic therapy started: [phone]  - Daily controller medication(s): Symbicort 160/4.5 two puffs twice daily with spacer + Flovent two puffs twice daily  + Singulair 10mg  daily + Dupixent every 2 weeks - Rescue medications: albuterol 4 puffs every 4-6 hours as needed, albuterol nebulizer one vial puffs every 4-6 hours as needed, or DuoNeb nebulizer one vial every 4-6 hours as needed - Changes during respiratory infections or worsening symptoms: Add on Pulmicort 0.5 mg nebulizer to THREE TREATMENTS three times daily for TWO WEEKS. - Asthma control goals:  * Full participation in all desired activities (may need albuterol before activity) * Albuterol use two time or less a week on average (not counting use with activity) * Cough interfering with sleep two time or less a month * Oral steroids no more than once a year * No hospitalizations  4. GERD - Continue  with omeprazole but increase to the 40mg  in the morning.      5. Blurry vision - We are going to refer her to Ophthalmology.   6. Return in about 6 months (around 11/24/2023).     Subjective:   EVERLEANER LINA is a 22 y.o. 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  Iracema is a 22 y.o. female presenting for a follow up visit.  She is was last seen in January 2024.  At that time, we continue with Singulair 10 mg daily as well as Xyzal 5 mg daily.  For her eczema, we  continue with Dupixent as well as clobetasol shampoo.  We did not do lung testing since she was sick.  We continue with Symbicort 160 mcg 2 puffs twice daily as well as Flovent 110 mcg 2 puffs twice daily, Singulair 10 mg daily, and Dupixent every 2 weeks.  For her GERD, we continue with omeprazole but increase this to 40 mg in the morning.  We added on famotidine 40 mg at night to help with reflux.  Since last visit, she has done well.    Asthma/Respiratory Symptom History: She has been very wheezy with the weather. She did not do a prednisone taper for this last couple of weeks. Then she weaned it down. She  has ot been to the ED at all for her symptoms. She mostly manages this at home with some prednisone that I prescribed in January. She remains on her Symbicort BID and Flovent BID. She also has home administration of Dupixent which seems to be working well. She has not been to the ED For her symptoms nad she has only been on prednisone intermittently over the last 6 months. I prescribed her a short burst at the last visit to have on hand and Mom doles out 2-3 tablets at a time during particular bad breathing episodes. However, this is enough to keep her symptoms under control and not end up in the hospital.   She needs a letter for a scooter. Apparently there is an organization that will provide her with a scooter for free if she has the correct paperwork. I tell Mom that I have never done anything like that and recommended asking her PCP first since this is likely something that they do routinely.   Allergic Rhinitis Symptom History: She has not had any sinus infections since the last visit. She had a lot of sneezing in the last few weeks.  She has a constant congested nose, but this is baseline for her and she has adamantly refused using nasal sprays. She has not been on any antibiotics for any sinus infections. Overall she has done very well for the most part.  Skin Symptom History: Mabrey is doing well with the use of topical steroids as needed. The Dupixent is helping to control her inflammation.   GERD Symptom History: She is on omeprazole only for her GERD. It is under fair control with the current regimen.   She has a lot of eye blurring. She had bad eye sight at baseline, but then after her cardiorespiratory arrest, it got worse. Degradation has gotten stable now. She needs a new ophthalmologist because she was previously  seeing a pediatric ophthalmologist.   Otherwise, there have been no changes to her past medical history, surgical history, family history, or social history.    Review of Systems  Constitutional: Negative.  Negative for chills, fever, malaise/fatigue and weight loss.  HENT: Negative.  Negative for congestion, ear discharge, ear pain and sinus pain.   Eyes:  Positive for blurred vision. Negative for pain, discharge and redness.  Respiratory:  Positive for shortness of breath. Negative for cough, sputum production, wheezing and stridor.   Cardiovascular: Negative.  Negative for chest pain and palpitations.  Gastrointestinal:  Negative for abdominal pain, constipation, diarrhea, heartburn, nausea and vomiting.  Skin: Negative.  Negative for itching and rash.  Neurological:  Negative for dizziness, tingling, tremors and headaches.  Endo/Heme/Allergies:  Positive for environmental allergies. Does not bruise/bleed easily.       Objective:   Blood pressure 112/80, pulse Marland Kitchen)  110, temperature 97.8 F (36.6 C), temperature source Temporal, resp. rate 18, SpO2 94 %. There is no height or weight on file to calculate BMI.    Physical Exam Vitals reviewed.  Constitutional:      Appearance: She is well-developed.  HENT:     Head: Normocephalic and atraumatic.     Right Ear: Tympanic membrane, ear canal and external ear normal.     Left Ear: Tympanic membrane, ear canal and external ear normal.     Nose: No nasal deformity, septal deviation, mucosal edema or rhinorrhea.     Right Turbinates: Enlarged, swollen and pale.     Left Turbinates: Enlarged, swollen and pale.     Right Sinus: No maxillary sinus tenderness or frontal sinus tenderness.     Left Sinus: No maxillary sinus tenderness or frontal sinus tenderness.     Comments: No nasal polyps noted.     Mouth/Throat:     Lips: Pink.     Mouth: Mucous membranes are moist. Mucous membranes are not pale and not dry.     Pharynx: Uvula  midline.  Eyes:     General: Lids are normal. No allergic shiner.       Right eye: No discharge.        Left eye: No discharge.     Conjunctiva/sclera: Conjunctivae normal.     Right eye: Right conjunctiva is not injected. No chemosis.    Left eye: Left conjunctiva is not injected. No chemosis.    Pupils: Pupils are equal, round, and reactive to light.  Cardiovascular:     Rate and Rhythm: Normal rate and regular rhythm.     Heart sounds: Normal heart sounds.  Pulmonary:     Effort: Pulmonary effort is normal. No tachypnea, accessory muscle usage or respiratory distress.     Breath sounds: Normal breath sounds. No wheezing, rhonchi or rales.     Comments: Moving air well in all lung fields.  There some slight expiratory wheezing at the bases. Thre are coarse upper airway sounds throughout the lung fields. This is baseline for her.  Chest:     Chest wall: No tenderness.  Lymphadenopathy:     Cervical: No cervical adenopathy.  Skin:    General: Skin is warm.     Capillary Refill: Capillary refill takes less than 2 seconds.     Coloration: Skin is not pale.     Findings: Rash present. No abrasion, erythema or petechiae. Rash is not papular, urticarial or vesicular.     Comments: She does have some eczematous patches behind her ears as well as behind her neck and upper back.  Overall, her face is fairly dry. No crusting or oozing noted. Excoriations over the lesions noted.   Neurological:     Mental Status: She is alert.  Psychiatric:        Behavior: Behavior is cooperative.      Diagnostic studies:    Spirometry: results normal (FEV1: 2.30/70%, FVC: 3.44/90%, FEV1/FVC: 67%).    Spirometry consistent with moderate obstructive disease.    Allergy Studies: none        Malachi Bonds, MD  [facility]

## 2023-05-26 ENCOUNTER — Encounter: Payer: Self-pay | Admitting: Allergy & Immunology

## 2023-05-30 ENCOUNTER — Other Ambulatory Visit (HOSPITAL_COMMUNITY): Payer: Self-pay

## 2023-06-10 NOTE — Progress Notes (Deleted)
Patient: [name] MRN: [id number] Sex: female DOB: September 20, 2001  Provider: Keturah Shavers, MD Location of Care: [facility]  Note type: Routine return visit  Referral Source: [facility] History from: [facility] chart and *** Chief Complaint: follow up hemiparesis  History of Present Illness:  [name] is a 22 y.o. female ***.  Review of Systems: Review of system as per HPI, otherwise negative.  Past Medical History:  Diagnosis Date   Allergy    Allergy    Multiple allergies - see allergy list   Alopecia    Asthma    Asthma    Family history of adverse reaction to anesthesia    Mom - Difficult to anesthesize/Hypotension   Headache    With wheezing per Mom   Obesity    Steroid induced per Mom   Pyelonephritis 07/12/2019   Vision abnormalities    Diminished vision Left Eye per Mom   Hospitalizations: {yes no:314532}, Head Injury: {yes no:314532}, Nervous System Infections: {yes no:314532}, Immunizations up to date: {yes no:314532}  Birth History ***  Surgical History Past Surgical History:  Procedure Laterality Date   ADENOIDECTOMY     ESOPHAGOGASTRODUODENOSCOPY (EGD) WITH PROPOFOL N/A 09/27/2017   Procedure: ESOPHAGOGASTRODUODENOSCOPY (EGD) WITH PROPOFOL;  Surgeon: Adelene Amas, MD;  Location: [facility] ENDOSCOPY;  Service: Gastroenterology;  Laterality: N/A;   PEG PLACEMENT N/A 09/27/2017   Procedure: PERCUTANEOUS ENDOSCOPIC GASTROSTOMY (PEG) PLACEMENT;  Surgeon: Adelene Amas, MD;  Location: [facility] ENDOSCOPY;  Service: Gastroenterology;  Laterality: N/A;   TONSILLECTOMY     WISDOM TOOTH EXTRACTION      Family History family history includes Asthma in her mother and paternal grandmother; Cancer in her maternal grandfather, mother, and paternal grandmother; Depression in her brother; Diabetes in her maternal grandfather and maternal grandmother; Hypertension in her maternal grandfather and maternal grandmother; Vision loss in her mother. Family  History is negative for ***.  Social History Social History   Socioeconomic History   Marital status: Single    Spouse name: Not on file   Number of children: Not on file   Years of education: Not on file   Highest education level: Not on file  Occupational History   Not on file  Tobacco Use   Smoking status: Never   Smokeless tobacco: Never   Tobacco comments:    no smokers in the home  Vaping Use   Vaping status: Never Used  Substance and Sexual Activity   Alcohol use: No   Drug use: No   Sexual activity: Never    Comment: Metformin for irregular menstrual cycles  Other Topics Concern   Not on file  Social History Narrative   Yamna is currently not in school   Patient lives at home with mom, dad, and brother.    Social Determinants of Health   Financial Resource Strain: Not on file  Food Insecurity: Not on file  Transportation Needs: Not on file  Physical Activity: Not on file  Stress: Not on file  Social Connections: Not on file     Allergies  Allergen Reactions   Azithromycin Anaphylaxis   Biaxin [Clarithromycin] Anaphylaxis   Erythromycin Anaphylaxis   Macrolides And Ketolides Anaphylaxis   Nitrofurantoin Monohyd Macro Anaphylaxis   Quinolones Anaphylaxis   Troleandomycin Anaphylaxis   Mango Flavor Hives    Physical Exam There were no vitals taken for this visit. ***  Assessment and Plan ***  No orders of the defined types were placed in this encounter.  No orders  of the defined types were placed in this encounter.

## 2023-06-14 ENCOUNTER — Other Ambulatory Visit (INDEPENDENT_AMBULATORY_CARE_PROVIDER_SITE_OTHER): Payer: Medicaid Other

## 2023-06-14 ENCOUNTER — Ambulatory Visit (INDEPENDENT_AMBULATORY_CARE_PROVIDER_SITE_OTHER): Payer: Self-pay | Admitting: Neurology

## 2023-06-28 ENCOUNTER — Other Ambulatory Visit: Payer: Self-pay

## 2023-06-28 ENCOUNTER — Other Ambulatory Visit (HOSPITAL_COMMUNITY): Payer: Self-pay

## 2023-07-01 ENCOUNTER — Other Ambulatory Visit: Payer: Self-pay

## 2023-07-01 MED ORDER — OMEPRAZOLE 20 MG PO CPDR: 20.0000 mg | DELAYED_RELEASE_CAPSULE | Freq: Every day | ORAL | 0 refills | Status: DC

## 2023-07-04 ENCOUNTER — Other Ambulatory Visit (INDEPENDENT_AMBULATORY_CARE_PROVIDER_SITE_OTHER): Payer: Self-pay

## 2023-07-04 ENCOUNTER — Telehealth (INDEPENDENT_AMBULATORY_CARE_PROVIDER_SITE_OTHER): Payer: Self-pay | Admitting: Neurology

## 2023-07-04 DIAGNOSIS — R252 Cramp and spasm: Secondary | ICD-10-CM

## 2023-07-04 DIAGNOSIS — R269 Unspecified abnormalities of gait and mobility: Secondary | ICD-10-CM

## 2023-07-04 DIAGNOSIS — G931 Anoxic brain damage, not elsewhere classified: Secondary | ICD-10-CM

## 2023-07-04 DIAGNOSIS — G252 Other specified forms of tremor: Secondary | ICD-10-CM

## 2023-07-04 MED ORDER — CLONAZEPAM 0.5 MG PO TABS
ORAL_TABLET | ORAL | 0 refills | Status: DC
Start: 2023-07-04 — End: 2023-08-04

## 2023-07-04 NOTE — Telephone Encounter (Signed)
Spoke with mom and sched follow up 10/9 next available appt. She reports she is unable to walk since she is out of medication. RN advised refill request was received late Friday afternoon by fax- RN sent message to MD to fill because it is a controlled substance but she also needs to resched missed appt. Mom was not aware she missed appt and does not know why she needs an EEG when she has not had one in the past. Advised to discuss with provider at appt.  Advised will resend message to refill medication.

## 2023-07-04 NOTE — Telephone Encounter (Signed)
Last OV 12/14/2022 Next OV was due in July with EEG but no showed both. No further OV sched or EEG Requesting Refill on Clonazepam.  RX written in Jan 2024 with 5 refills

## 2023-07-04 NOTE — Telephone Encounter (Signed)
Who's calling (name and relationship to patient) : [name] Arvidson; mom   Best contact number: [phone]  Provider they see: Dr. Merri Brunette   Reason for call: Mom has called in needing a refill, she stated the pharmacy has reached out to our office. Mom stated that since Philomina doesn't have her mediation she is at home not able to walk. Clonazepam    Call ID:      PRESCRIPTION REFILL ONLY  Name of prescription:  Pharmacy:

## 2023-07-21 ENCOUNTER — Other Ambulatory Visit (HOSPITAL_COMMUNITY): Payer: Self-pay

## 2023-07-26 ENCOUNTER — Other Ambulatory Visit: Payer: Self-pay

## 2023-07-27 ENCOUNTER — Other Ambulatory Visit: Payer: Self-pay

## 2023-08-02 ENCOUNTER — Telehealth (INDEPENDENT_AMBULATORY_CARE_PROVIDER_SITE_OTHER): Payer: Self-pay

## 2023-08-02 NOTE — Telephone Encounter (Signed)
Form faxed back through Epic with notes for incontinence supplies

## 2023-08-03 ENCOUNTER — Other Ambulatory Visit: Payer: Self-pay | Admitting: Allergy & Immunology

## 2023-08-03 NOTE — Telephone Encounter (Signed)
  Name of who is calling: [name]   Caller's Relationship to Patient: mom  Best contact number: [phone]  Provider they see: Nab   Reason for call: Mom called regarding medication refills on last note, she stated pharmacy is needing prior authorization.      PRESCRIPTION REFILL ONLY  Name of prescription: clonazepam and amantadine   Pharmacy:

## 2023-08-04 ENCOUNTER — Other Ambulatory Visit (INDEPENDENT_AMBULATORY_CARE_PROVIDER_SITE_OTHER): Payer: Self-pay | Admitting: Neurology

## 2023-08-04 DIAGNOSIS — R252 Cramp and spasm: Secondary | ICD-10-CM

## 2023-08-04 DIAGNOSIS — G931 Anoxic brain damage, not elsewhere classified: Secondary | ICD-10-CM

## 2023-08-04 DIAGNOSIS — G252 Other specified forms of tremor: Secondary | ICD-10-CM

## 2023-08-04 MED ORDER — CLONIDINE HCL 0.1 MG PO TABS: ORAL_TABLET | ORAL | 1 refills | Status: DC

## 2023-08-04 MED ORDER — CLONAZEPAM 0.5 MG PO TABS
ORAL_TABLET | ORAL | 0 refills | Status: DC
Start: 2023-08-04 — End: 2023-08-31

## 2023-08-04 NOTE — Addendum Note (Signed)
Addended by: Vita Barley B on: 08/04/2023 11:06 AM   Modules accepted: Orders

## 2023-08-04 NOTE — Telephone Encounter (Signed)
  Name of who is calling: [name] Caldron  Caller's Relationship to Patient: Mom  Best contact number: [phone]  Provider they see: Dr. Merri Brunette  Reason for call: Mom said only one medication was called in yesterday and they are needing Clonidine refilled. Mom said she needs medication right away, she can't walk or go to the bathroom without them.      PRESCRIPTION REFILL ONLY  Name of prescription: Clonidine  Pharmacy: [facility]

## 2023-08-04 NOTE — Telephone Encounter (Signed)
Mother called back to check on status of Clonazepam refill to get her daughter to the appt.

## 2023-08-04 NOTE — Telephone Encounter (Signed)
Call to [facility]- they report only med requiring a PA is Elidel. They did receive the Clonidine order. Advised RN sent message to MD need one more refill on Clonazepam to last until OV.

## 2023-08-16 ENCOUNTER — Other Ambulatory Visit: Payer: Self-pay | Admitting: Allergy & Immunology

## 2023-08-30 ENCOUNTER — Other Ambulatory Visit: Payer: Self-pay | Admitting: Allergy & Immunology

## 2023-08-30 ENCOUNTER — Other Ambulatory Visit: Payer: Self-pay

## 2023-08-30 MED ORDER — DUPIXENT 300 MG/2ML ~~LOC~~ SOSY
300.0000 mg | PREFILLED_SYRINGE | SUBCUTANEOUS | 11 refills | Status: DC
  Filled 2023-08-30: qty 4, 28d supply, fill #0
  Filled 2023-09-26: qty 4, 28d supply, fill #1
  Filled 2024-01-11 (×2): qty 4, 28d supply, fill #2
  Filled 2024-05-09: qty 4, 28d supply, fill #3
  Filled 2024-06-04 – 2024-06-06 (×2): qty 4, 28d supply, fill #4
  Filled 2024-06-29: qty 4, 28d supply, fill #5
  Filled 2024-07-27: qty 4, 28d supply, fill #6
  Filled 2024-08-23: qty 4, 28d supply, fill #7

## 2023-08-30 NOTE — Progress Notes (Signed)
Refill received

## 2023-08-30 NOTE — Progress Notes (Signed)
Specialty Pharmacy Refill Coordination Note  [name] is a 22 y.o. female contacted today regarding refills of specialty medication(s) Dupilumab   Patient requested Delivery   Delivery date: 09/02/23   Verified address: [address] [place], [address]   Medication will be filled on 09/01/2023.

## 2023-08-31 ENCOUNTER — Encounter (INDEPENDENT_AMBULATORY_CARE_PROVIDER_SITE_OTHER): Payer: Self-pay | Admitting: Neurology

## 2023-08-31 ENCOUNTER — Ambulatory Visit (INDEPENDENT_AMBULATORY_CARE_PROVIDER_SITE_OTHER): Payer: Medicaid Other | Admitting: Neurology

## 2023-08-31 VITALS — BP 110/70 | HR 76 | Ht 63.94 in | Wt 251.8 lb

## 2023-08-31 DIAGNOSIS — Z72821 Inadequate sleep hygiene: Secondary | ICD-10-CM | POA: Diagnosis not present

## 2023-08-31 DIAGNOSIS — M62830 Muscle spasm of back: Secondary | ICD-10-CM

## 2023-08-31 DIAGNOSIS — G253 Myoclonus: Secondary | ICD-10-CM

## 2023-08-31 DIAGNOSIS — R252 Cramp and spasm: Secondary | ICD-10-CM

## 2023-08-31 DIAGNOSIS — R269 Unspecified abnormalities of gait and mobility: Secondary | ICD-10-CM

## 2023-08-31 DIAGNOSIS — G252 Other specified forms of tremor: Secondary | ICD-10-CM

## 2023-08-31 DIAGNOSIS — G931 Anoxic brain damage, not elsewhere classified: Secondary | ICD-10-CM | POA: Diagnosis not present

## 2023-08-31 MED ORDER — GABAPENTIN 300 MG PO CAPS
300.0000 mg | ORAL_CAPSULE | Freq: Three times a day (TID) | ORAL | 7 refills | Status: DC
Start: 2023-08-31 — End: 2024-05-16

## 2023-08-31 MED ORDER — BACLOFEN 20 MG PO TABS
20.0000 mg | ORAL_TABLET | Freq: Three times a day (TID) | ORAL | 7 refills | Status: DC
Start: 2023-08-31 — End: 2024-05-16

## 2023-08-31 MED ORDER — CLONIDINE HCL 0.1 MG PO TABS
ORAL_TABLET | ORAL | 7 refills | Status: DC
Start: 2023-08-31 — End: 2024-05-16

## 2023-08-31 MED ORDER — LEVETIRACETAM 250 MG PO TABS: 250.0000 mg | ORAL_TABLET | Freq: Two times a day (BID) | ORAL | 8 refills | Status: DC

## 2023-08-31 MED ORDER — CLONAZEPAM 0.5 MG PO TABS
ORAL_TABLET | ORAL | 5 refills | Status: DC
Start: 2023-08-31 — End: 2024-02-21

## 2023-08-31 NOTE — Progress Notes (Signed)
Patient: [name] MRN: [id number] Sex: female DOB: 2001/08/24  Provider: Keturah Shavers, MD Location of Care: [facility]  Note type: Routine return visit  Referral Source: PCP History from: patient and [facility] chart Chief Complaint:  Anoxic brain injury [facility])     History of Present Illness: [name] is a 22 y.o. female is here for follow-up management of anoxic brain injury with spasticity, some degree of quadriparesis, difficulty with ambulation and speech as well as significant sleep difficulty. Apparently she was on Keppra initially for seizure but then she was recommended to continue Keppra by movement specialist at [facility] to help with dystonic and myoclonic movements along with the clonazepam. He was last seen in January and at that time since she was still having significant difficulty sleeping with taking trazodone, it was switched to clonidine to help with sleep and also the dose of Keppra decreased to 250 mg twice daily. Since her last visit she has been doing fairly well without having any new symptoms although she is having frequent headaches off and on for which she may need to take OTC medications and also she is still having significant difficulty sleeping and occasionally she may stay awake for 2 or 3 days in a row. She has not been ambulating significantly due to having difficulty with her movements due to some degree of weakness. She has not had any difficulty with balance or significant dizziness or visual changes or any vomiting although she and her mother thinks that she is not able to see well as before and she would like to be seen by ophthalmology.  She has been tolerating all her medications fairly well.    Review of Systems: Review of system as per HPI, otherwise negative.  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     Surgical History Past Surgical History:  Procedure Laterality Date   ADENOIDECTOMY     ESOPHAGOGASTRODUODENOSCOPY (EGD) WITH PROPOFOL N/A 09/27/2017   Procedure: ESOPHAGOGASTRODUODENOSCOPY (EGD) WITH PROPOFOL;  Surgeon: Adelene Amas, MD;  Location: [facility] ENDOSCOPY;  Service: Gastroenterology;  Laterality: N/A;   PEG PLACEMENT N/A 09/27/2017   Procedure: PERCUTANEOUS ENDOSCOPIC GASTROSTOMY (PEG) PLACEMENT;  Surgeon: Adelene Amas, MD;  Location: [facility] ENDOSCOPY;  Service: Gastroenterology;  Laterality: N/A;   TONSILLECTOMY     WISDOM TOOTH EXTRACTION      Family History family history includes Asthma in her mother and paternal grandmother; Cancer in her maternal grandfather, mother, and paternal grandmother; Depression in her brother; Diabetes in her maternal grandfather and maternal grandmother; Hypertension in her maternal grandfather and maternal grandmother; Vision loss in her mother.   Social History Social History   Socioeconomic History   Marital status: Single    Spouse name: Not on file   Number of children: Not on file   Years of education: Not on file   Highest education level: Not on file  Occupational History   Not on  file  Tobacco Use   Smoking status: Never   Smokeless tobacco: Never   Tobacco comments:    no smokers in the home  Vaping Use   Vaping status: Never Used  Substance and Sexual Activity   Alcohol use: No   Drug use: No   Sexual activity: Never    Comment: Metformin for irregular menstrual cycles  Other Topics Concern   Not on file  Social History Narrative   Patient lives at home with mom, dad.    Enjoys  singing, drawing and doing make up   Social Determinants of Health   Financial Resource Strain: Not on file  Food Insecurity: Not on file  Transportation Needs: Not on file  Physical Activity: Not on file  Stress: Not on file  Social Connections: Not on file     Allergies  Allergen Reactions   Azithromycin Anaphylaxis   Biaxin [Clarithromycin] Anaphylaxis   Erythromycin Anaphylaxis   Macrolides And Ketolides Anaphylaxis   Nitrofurantoin Monohyd Macro Anaphylaxis   Quinolones Anaphylaxis   Troleandomycin Anaphylaxis   Mango Flavor Hives    Physical Exam BP 110/70 (BP Location: Right Arm, Patient Position: Sitting)   Pulse 76   Ht 5' 3.94" (1.624 m)   Wt 251 lb 12.3 oz (114.2 kg)   LMP  (LMP Unknown)   BMI 43.30 kg/m  Gen: Awake, alert, not in distress,  Skin: No neurocutaneous stigmata, no rash HEENT: Normocephalic, no dysmorphic features, no conjunctival injection, nares patent, mucous membranes moist, oropharynx clear. Neck: Supple, no meningismus, no lymphadenopathy,  Resp: Clear to auscultation bilaterally CV: Regular rate, normal S1/S2,  Abd: Bowel sounds present, abdomen soft, non-tender, non-distended.  No hepatosplenomegaly or mass. Ext: Warm and well-perfused. no muscle wasting, ROM full.  Neurological Examination: MS- Awake, alert, interactive Cranial Nerves- Pupils equal, round and reactive to light (5 to 3mm); fix and follows with full and smooth EOM; no nystagmus; no ptosis, funduscopy with normal sharp discs, visual field full by looking at the toys on the side, face symmetric with smile.  Hearing intact to bell bilaterally, palate elevation is symmetric, and tongue protrusion is symmetric. Tone-slightly increased on the left side and in the lower extremities Strength-Seems to have good strength, symmetrically by observation and passive movement although very slightly weaker on the left side Reflexes-    Biceps Triceps Brachioradialis Patellar Ankle  R  2+ 2+ 2+ 2+ 2+  L 2+ 2+ 2+ 2+ 2+   Plantar responses flexor bilaterally, no clonus noted Sensation- Withdraw at four limbs to stimuli. Coordination- Reached to the object with slight dysmetria on finger-to-nose and moderate tremor of the hands. Gait: Able to stand on her feet and walk with walker   Assessment and Plan 1. Neurologic gait disorder   2. Spasticity   3. Anoxic brain injury (HCC)   4. Intention tremor   5. Action induced myoclonus   6. Poor sleep hygiene   7. Muscle spasm of back    This is a 22 year old female with anoxic brain injury since 2018 following a cardiac arrest with some degree of mild quadriparesis, episodes of tremor and myoclonus, significant sleep difficulty and problem with ambulation, currently on multiple medications, fairly stable although still having significant sleep issues. At this time I would not change any of her medications and I do not want to add another medication as a preventive medication for headache since it may cause more side effects and drug interaction then helping her. I think if she would be  able to help with better sleep through the night, she may have less frequent headaches so since we have tried different medications without any help, she needs to be seen by sleep specialist for further evaluation and then decide about any medication or therapy or if there is any need for sleep study.  She needs to get a referral from her primary care physician to see adult sleep specialist. Also she may need to get a referral to see ophthalmology for official eye exam I would recommend her to follow-up with movement specialist at [facility] at least once a year to help with some of the medications She needs to have regular exercise as much as possible and tolerated on a daily basis but I will send a referral to physical therapy as well to see if they would be able to help her with more ambulation I would like to see her in 8 months for follow-up visit to  refill her medications and if there is any need to adjust some of the medications or discontinue some of the medications.  She and her mother understood and agreed with the plan.   Meds ordered this encounter  Medications   levETIRAcetam (KEPPRA) 250 MG tablet    Sig: Take 1 tablet (250 mg total) by mouth 2 (two) times daily.    Dispense:  60 tablet    Refill:  8   gabapentin (NEURONTIN) 300 MG capsule    Sig: Take 1 capsule (300 mg total) by mouth 3 (three) times daily.    Dispense:  90 capsule    Refill:  7   cloNIDine (CATAPRES) 0.1 MG tablet    Sig: Take 1 tablet in a.m. and 2 tablets in p.m.    Dispense:  90 tablet    Refill:  7   clonazePAM (KLONOPIN) 0.5 MG tablet    Sig: Take 2 tablets 2 times a day    Dispense:  124 tablet    Refill:  5   baclofen (LIORESAL) 20 MG tablet    Sig: Take 1 tablet (20 mg total) by mouth 3 (three) times daily.    Dispense:  90 tablet    Refill:  7   Orders Placed This Encounter  Procedures   Ambulatory referral to Physical Therapy    Referral Priority:   Routine    Referral Type:   Physical Medicine    Referral Reason:   Specialty Services Required    Requested Specialty:   Physical Therapy    Number of Visits Requested:   1

## 2023-08-31 NOTE — Patient Instructions (Signed)
Continue the same dose of medications Have more hydration with better sleep through the night to help with the headaches Get a referral to see a sleep specialist at [facility] for evaluation of sleep and if there is any sleep study needed Follow-up with ophthalmology for eye exam Follow-up with movement specialist at [facility] as well Have more regular exercise as possible I will send a referral for physical therapy Return in 8 months for follow-up visit

## 2023-09-01 ENCOUNTER — Other Ambulatory Visit: Payer: Self-pay

## 2023-09-07 ENCOUNTER — Telehealth (INDEPENDENT_AMBULATORY_CARE_PROVIDER_SITE_OTHER): Payer: Self-pay | Admitting: Neurology

## 2023-09-07 NOTE — Telephone Encounter (Signed)
Who's calling (name and relationship to patient) : [name]; mom   Best contact number:  [phone]  Provider they see: Dr. Merri Brunette   Reason for call: Mom is calling in to see if Dr.nab could fill out a form for [name] to get a handicap pass, old one has expired. She stated that she called months ago; and never got a call back. They recently had an appt and it slipped moms mind when the discussion of meds came up. Mom is requesting a call back.     Call ID:      PRESCRIPTION REFILL ONLY  Name of prescription:  Pharmacy:

## 2023-09-09 ENCOUNTER — Ambulatory Visit: Payer: Medicaid Other | Attending: Neurology | Admitting: Physical Therapy

## 2023-09-12 NOTE — Telephone Encounter (Signed)
 Attempted to contact patients mother. Mother unable to be reached  LVM to call back.  SS, CCMA

## 2023-09-14 NOTE — Telephone Encounter (Addendum)
Mom called back back.  Verified patients name and DOB as well as mothers name.   Informed mom that Dr. Merri Brunette is willing to complete the form. Asked mom to drop off the form at her earliest convenience.   Mom verbalized understanding of this and stated that she would drop the forms off.  SS, CCMA

## 2023-09-15 ENCOUNTER — Ambulatory Visit: Payer: Medicaid Other | Admitting: Physical Therapy

## 2023-09-15 NOTE — Telephone Encounter (Signed)
  Name of who is calling: Daen   Caller's Relationship to Patient: Mom   Best contact number: [phone]  Provider they see: Dr Devonne Doughty   Reason for call: Mom called stating that she will email forms over today.      PRESCRIPTION REFILL ONLY  Name of prescription:  Pharmacy:

## 2023-09-15 NOTE — Telephone Encounter (Signed)
Form signed- lvm it is upfront for pick up

## 2023-09-15 NOTE — Telephone Encounter (Signed)
Handicap placard form printed online, completed and given to Dr. Devonne Doughty to sign

## 2023-09-22 ENCOUNTER — Other Ambulatory Visit: Payer: Self-pay

## 2023-09-26 ENCOUNTER — Other Ambulatory Visit (HOSPITAL_COMMUNITY): Payer: Self-pay | Admitting: Pharmacy Technician

## 2023-09-26 ENCOUNTER — Other Ambulatory Visit (HOSPITAL_COMMUNITY): Payer: Self-pay

## 2023-09-26 ENCOUNTER — Other Ambulatory Visit: Payer: Self-pay

## 2023-09-26 NOTE — Progress Notes (Signed)
Specialty Pharmacy Refill Coordination Note  [name] is a 22 y.o. female contacted today regarding refills of specialty medication(s) Dupilumab   Patient requested Delivery   Delivery date: 09/30/23   Verified address: [address] [address]   Medication will be filled on 09/26/23.  Delivery date 09/27/23 Inject due on 11/08 base on the dates enter. Did try to call patient to verify left voicemail.

## 2023-10-17 ENCOUNTER — Other Ambulatory Visit (HOSPITAL_COMMUNITY): Payer: Self-pay

## 2023-10-18 ENCOUNTER — Ambulatory Visit (INDEPENDENT_AMBULATORY_CARE_PROVIDER_SITE_OTHER): Payer: Medicaid Other | Admitting: Allergy & Immunology

## 2023-10-18 ENCOUNTER — Encounter: Payer: Self-pay | Admitting: Allergy & Immunology

## 2023-10-18 ENCOUNTER — Other Ambulatory Visit: Payer: Self-pay

## 2023-10-18 VITALS — BP 130/88 | HR 121 | Temp 97.2°F | Resp 24 | Ht 64.0 in | Wt 235.8 lb

## 2023-10-18 DIAGNOSIS — K219 Gastro-esophageal reflux disease without esophagitis: Secondary | ICD-10-CM

## 2023-10-18 DIAGNOSIS — J3089 Other allergic rhinitis: Secondary | ICD-10-CM | POA: Diagnosis not present

## 2023-10-18 DIAGNOSIS — J4550 Severe persistent asthma, uncomplicated: Secondary | ICD-10-CM | POA: Diagnosis not present

## 2023-10-18 DIAGNOSIS — L2089 Other atopic dermatitis: Secondary | ICD-10-CM | POA: Diagnosis not present

## 2023-10-18 DIAGNOSIS — H66002 Acute suppurative otitis media without spontaneous rupture of ear drum, left ear: Secondary | ICD-10-CM

## 2023-10-18 DIAGNOSIS — J302 Other seasonal allergic rhinitis: Secondary | ICD-10-CM

## 2023-10-18 MED ORDER — METHYLPREDNISOLONE ACETATE 80 MG/ML IJ SUSP
80.0000 mg | Freq: Once | INTRAMUSCULAR | Status: AC
Start: 2023-10-18 — End: 2023-10-18
  Administered 2023-10-18: 80 mg via INTRAMUSCULAR

## 2023-10-18 MED ORDER — PREDNISONE 10 MG PO TABS: ORAL_TABLET | ORAL | 0 refills | Status: DC

## 2023-10-18 MED ORDER — AMOXICILLIN-POT CLAVULANATE 875-125 MG PO TABS: 1.0000 | ORAL_TABLET | Freq: Two times a day (BID) | ORAL | 0 refills | Status: AC

## 2023-10-18 NOTE — Patient Instructions (Addendum)
1. Allergic rhinoconjunctivitis - with overlying left sided ear infection  - Continue with the Singulair 10mg  daily.  - Continue with Xyzal (levocetirizine) 5mg  tablet once daily.  - We are going to start Augmentin twice daily for two weeks for the left sided ear infection. - Call us with an update.   2. Eczema - Continue with Dupixent as you are doing.  - Continue with clobetasol shampoo twice every other week like you are doing.  3. Severe persistent asthma, uncomplicated - DepoMedrol provided in clinic today. - We will send in a prednisone burst to get ahead of this.  - Call the [facility] for a mask.  - Daily controller medication(s): Symbicort 160/4.5 two puffs twice daily with spacer + Flovent two puffs twice daily  + Singulair 10mg  daily + Dupixent every 2 weeks - Rescue medications: albuterol 4 puffs every 4-6 hours as needed, albuterol nebulizer one vial puffs every 4-6 hours as needed, or DuoNeb nebulizer one vial every 4-6 hours as needed - Changes during respiratory infections or worsening symptoms: Add on Pulmicort 0.5 mg nebulizer to THREE TREATMENTS three times daily for TWO WEEKS. - Asthma control goals:  * Full participation in all desired activities (may need albuterol before activity) * Albuterol use two time or less a week on average (not counting use with activity) * Cough interfering with sleep two time or less a month * Oral steroids no more than once a year * No hospitalizations  4. GERD - Continue with omeprazole 40mg  in the morning.      5. Return in about 4 months (around 02/15/2024). You can have the follow up appointment with Dr. Dellis Anes or a Nurse Practicioner (our Nurse Practitioners are excellent and always have Physician oversight!).    Please inform us of any Emergency Department visits, hospitalizations, or changes in symptoms. Call us before going to the ED for breathing or allergy symptoms since we might be able to fit you in for a sick  visit. Feel free to contact us anytime with any questions, problems, or concerns.  It was a pleasure to see you again today!  Websites that have reliable patient information: 1. American Academy of Asthma, Allergy, and Immunology: www.aaaai.org 2. Food Allergy Research and Education (FARE): foodallergy.org 3. Mothers of Asthmatics: http://www.asthmacommunitynetwork.org 4. American College of Allergy, Asthma, and Immunology: www.acaai.org      "Like" [place] on Facebook and Instagram for our latest updates!      A healthy democracy works best when [employer] participate! Make sure you are registered to vote! If you have moved or changed any of your contact information, you will need to get this updated before voting! Scan the QR codes below to learn more!

## 2023-10-18 NOTE — Progress Notes (Unsigned)
FOLLOW UP  Date of Service/Encounter:  10/18/23   Assessment:   No diagnosis found.  Plan/Recommendations:   Assessment and Plan              There are no Patient Instructions on file for this visit.   Subjective:   [name] is a 22 y.o. female presenting today for follow up of  Chief Complaint  Patient presents with  . Wheezing    X 3 dys Symptoms: cough/nasal - yellowish    [name] has a history of the following: Patient Active Problem List   Diagnosis Date Noted  . Muscle spasm of back 07/14/2019  . Pyelonephritis 07/12/2019  . COVID-19 07/12/2019  . Peripheral neuropathy 08/24/2018  . Poor sleep hygiene 07/11/2018  . 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 Left hemiparesis (HCC) 01/17/2018  . 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 Anoxic brain injury (HCC) 09/28/2017  . 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 Spasticity 09/28/2017  . Encephalopathy 09/19/2017  . PCOS (polycystic ovarian syndrome) 09/29/2016  . Low bone density 09/29/2016  . 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 Severe asthma with acute exacerbation 07/26/2015  . Seasonal and perennial allergic rhinitis 07/26/2015  . Overuse of medication 07/26/2015  . Obesity peds (BMI >=95 percentile) 02/29/2012    History obtained from: chart review and {Persons; PED relatives w/patient:19415::"patient"}.  Discussed the use of AI scribe software for clinical note transcription with the patient and/or guardian, who gave verbal consent to proceed.  [name] is a 22 y.o. female presenting for {Blank single:19197::"a food challenge","a drug challenge","skin testing","a sick visit","an evaluation of ***","a follow up visit"}.  Discussed the use of AI scribe software for clinical note transcription with the patient, who gave verbal consent to proceed.  History of Present Illness            {Blank  single:19197::"Asthma/Respiratory Symptom History: ***"," "}  {Blank single:19197::"Allergic Rhinitis Symptom History: ***"," "}  {Blank single:19197::"Food Allergy Symptom History: ***"," "}  {Blank single:19197::"Skin Symptom History: ***"," "}  {Blank single:19197::"GERD Symptom History: ***"," "}  Otherwise, there have been no changes to her past medical history, surgical history, family history, or social history.    Review of systems otherwise negative other than that mentioned in the HPI.    Objective:   Blood pressure 130/88, pulse (!) 121, temperature (!) 97.2 F (36.2 C), temperature source Temporal, resp. rate (!) 24, height 5\' 4"  (1.626 m), weight 235 lb 12.8 oz (107 kg), SpO2 94%. Body mass index is 40.47 kg/m.    Physical Exam   Diagnostic studies: {Blank single:19197::"none","deferred due to recent antihistamine use","deferred due to insurance stipulations that require a separate visit for testing","labs sent instead"," "}  Spirometry: {Blank single:19197::"results normal (FEV1: ***%, FVC: ***%, FEV1/FVC: ***%)","results abnormal (FEV1: ***%, FVC: ***%, FEV1/FVC: ***%)"}.    {Blank single:19197::"Spirometry consistent with mild obstructive disease","Spirometry consistent with moderate obstructive disease","Spirometry consistent with severe obstructive disease","Spirometry consistent with possible restrictive disease","Spirometry consistent with mixed obstructive and restrictive disease","Spirometry uninterpretable due to technique","Spirometry consistent with normal pattern"}. {Blank single:19197::"Albuterol/Atrovent nebulizer","Xopenex/Atrovent nebulizer","Albuterol nebulizer","Albuterol four puffs via MDI","Xopenex four puffs via MDI"} treatment given in clinic with {Blank single:19197::"significant improvement in FEV1 per ATS criteria","significant improvement in FVC per ATS criteria","significant improvement in FEV1 and FVC per ATS criteria","improvement in FEV1,  but not significant per ATS criteria","improvement in FVC, but not significant per ATS criteria","improvement in FEV1 and FVC, but not significant per ATS criteria","no improvement"}.  Allergy Studies: {Blank single:19197::"none","deferred due to recent antihistamine use","deferred due to insurance stipulations that require a separate visit for testing","labs sent instead"," "}    {Blank single:19197::"Allergy testing results were read and interpreted by myself, documented by clinical staff."," "}      Malachi Bonds, MD  [facility]

## 2023-10-19 MED ORDER — LEVOCETIRIZINE DIHYDROCHLORIDE 5 MG PO TABS: 5.0000 mg | ORAL_TABLET | Freq: Every evening | ORAL | 1 refills | Status: DC

## 2023-10-19 MED ORDER — OMEPRAZOLE 20 MG PO CPDR: 20.0000 mg | DELAYED_RELEASE_CAPSULE | Freq: Every day | ORAL | 1 refills | Status: DC

## 2023-10-19 MED ORDER — FAMOTIDINE 40 MG PO TABS: 40.0000 mg | ORAL_TABLET | Freq: Every day | ORAL | 1 refills | Status: DC

## 2023-10-19 MED ORDER — CLOBETASOL PROPIONATE 0.05 % EX SOLN: Freq: Two times a day (BID) | CUTANEOUS | 1 refills | Status: DC | PRN

## 2023-10-19 MED ORDER — MONTELUKAST SODIUM 10 MG PO TABS: 10.0000 mg | ORAL_TABLET | Freq: Every day | ORAL | 1 refills | Status: DC

## 2023-10-19 MED ORDER — FLOVENT HFA 110 MCG/ACT IN AERO: 2.0000 | INHALATION_SPRAY | Freq: Two times a day (BID) | RESPIRATORY_TRACT | 1 refills | Status: DC

## 2023-10-19 MED ORDER — SYMBICORT 160-4.5 MCG/ACT IN AERO: 2.0000 | INHALATION_SPRAY | Freq: Two times a day (BID) | RESPIRATORY_TRACT | 1 refills | Status: DC

## 2023-10-21 ENCOUNTER — Other Ambulatory Visit: Payer: Self-pay

## 2023-10-24 ENCOUNTER — Other Ambulatory Visit: Payer: Self-pay

## 2023-11-11 ENCOUNTER — Ambulatory Visit: Payer: Medicaid Other | Attending: Neurology | Admitting: Physical Therapy

## 2023-11-11 DIAGNOSIS — M6281 Muscle weakness (generalized): Secondary | ICD-10-CM | POA: Insufficient documentation

## 2023-11-11 DIAGNOSIS — R2689 Other abnormalities of gait and mobility: Secondary | ICD-10-CM | POA: Insufficient documentation

## 2023-11-11 DIAGNOSIS — R269 Unspecified abnormalities of gait and mobility: Secondary | ICD-10-CM | POA: Diagnosis not present

## 2023-11-11 DIAGNOSIS — R252 Cramp and spasm: Secondary | ICD-10-CM | POA: Diagnosis not present

## 2023-11-11 NOTE — Therapy (Signed)
OUTPATIENT PHYSICAL THERAPY NEURO EVALUATION   Patient Name: [name] MRN: [id number] DOB:07-Oct-2001, 22 y.o., female Today's Date: 11/11/2023   PCP: Otelia Limes PROVIDER: Keturah Shavers, MD  END OF SESSION:  PT End of Session - 11/11/23 1302     Visit Number 1    Authorization Type Medicaid    PT Start Time 1300    PT Stop Time 1335    PT Time Calculation (min) 35 min    Activity Tolerance Patient tolerated treatment well    Behavior During Therapy WFL for tasks assessed/performed             Past Medical History:  Diagnosis Date   Allergy    Allergy    Multiple allergies - see allergy list   Alopecia    Asthma    Asthma    Family history of adverse reaction to anesthesia    Mom - Difficult to anesthesize/Hypotension   Headache    With wheezing per Mom   Obesity    Steroid induced per Mom   Pyelonephritis 07/12/2019   Vision abnormalities    Diminished vision Left Eye per Mom   Past Surgical History:  Procedure Laterality Date   ADENOIDECTOMY     ESOPHAGOGASTRODUODENOSCOPY (EGD) WITH PROPOFOL N/A 09/27/2017   Procedure: ESOPHAGOGASTRODUODENOSCOPY (EGD) WITH PROPOFOL;  Surgeon: Adelene Amas, MD;  Location: [facility] ENDOSCOPY;  Service: Gastroenterology;  Laterality: N/A;   PEG PLACEMENT N/A 09/27/2017   Procedure: PERCUTANEOUS ENDOSCOPIC GASTROSTOMY (PEG) PLACEMENT;  Surgeon: Adelene Amas, MD;  Location: [facility] ENDOSCOPY;  Service: Gastroenterology;  Laterality: N/A;   TONSILLECTOMY     WISDOM TOOTH EXTRACTION     Patient Active Problem List   Diagnosis Date Noted   Muscle spasm of back 07/14/2019   Pyelonephritis 07/12/2019   COVID-19 07/12/2019   Peripheral neuropathy 08/24/2018   Poor sleep hygiene 07/11/2018   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   Left hemiparesis (HCC) 01/17/2018   Abnormality on bone densitometry 11/28/2017   Anoxic brain injury (HCC) 09/28/2017    Sympathetic storming 09/28/2017   Spasticity 09/28/2017   Encephalopathy 09/19/2017   PCOS (polycystic ovarian syndrome) 09/29/2016   Low bone density 09/29/2016   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 Mother reports she would like pt to work on core stability and getting her strength back. Mother is wanting an exercise program for pt as she stays in bed all day. [name] states pt reports back pain and headaches due to lying in bed all day. Wants to get the denial letter from previous scooter evaluation to push it through CAP. Pt reports she had a fall about two weeks ago in which she tripped over her walker and landed on her knees. Was able to get up on her own and states she has not had a fall in a long time.  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 No Reports occasional low back pain  PRECAUTIONS: Fall  RED FLAGS: None   WEIGHT BEARING RESTRICTIONS: No  FALLS: Has patient fallen in last 6 months? Yes. Number of falls 1  LIVING ENVIRONMENT: Lives with: lives with their family Lives in: House/apartment Stairs: Yes: Internal: 2  flights steps; pt uses chair lift  Has following equipment at home: Walker - 4 wheeled, shower chair, bed side commode, Grab bars, and Chair lift   PLOF: Requires assistive device for independence, Needs assistance with ADLs, and Needs assistance with homemaking  PATIENT GOALS: "Core, that's pretty much it"   OBJECTIVE:  Note: Objective measures were completed at Evaluation unless otherwise noted.  DIAGNOSTIC FINDINGS: MRI of brain from 2018  IMPRESSION: Mildly abnormal areas of restricted diffusion in the BILATERAL posterior frontal cortex, LEFT greater than RIGHT consistent with mild anoxic injury. No involvement of the globus pallidus or other brain structures.  COGNITION: Overall cognitive status: Impaired   SENSATION: Pt denies numbness/tingling in all extremities    POSTURE: rounded shoulders and forward head  LOWER EXTREMITY ROM:   WFL   Active  Right Eval Left Eval  Hip flexion    Hip extension    Hip abduction    Hip adduction    Hip internal rotation    Hip external rotation    Knee flexion    Knee extension    Ankle dorsiflexion    Ankle plantarflexion    Ankle inversion    Ankle eversion     (Blank rows = not tested)  LOWER EXTREMITY MMT:  Tested in seated position   MMT Right Eval Left Eval  Hip flexion 4+ 4  Hip extension    Hip abduction 5 4  Hip adduction 5 4  Hip internal rotation    Hip external rotation    Knee flexion 5 4  Knee extension 4 4-  Ankle dorsiflexion 4+ 4+  Ankle plantarflexion    Ankle inversion    Ankle eversion    (Blank rows = not tested)  BED MOBILITY:  Independent per pt   TRANSFERS: Assistive device utilized: Environmental consultant - 4 wheeled  Sit to stand: Complete Independence Stand to sit: Complete Independence  GAIT: Gait pattern: step through pattern, decreased stride length, decreased hip/knee flexion- Right, decreased hip/knee flexion- Left, decreased ankle dorsiflexion- Right, decreased ankle dorsiflexion- Left,  and narrow BOS Distance walked: various short clinic distances  Assistive device utilized: Walker - 4 wheeled Level of assistance: Modified independence Comments: No instability noted   FUNCTIONAL TESTS:   [facility] PT Assessment - 11/11/23 1317       Transfers   Five time sit to stand comments  15.65s   no UE support     Ambulation/Gait   Gait velocity 32.8' over 14.32s = 2.3 ft/s   rollator, mod I                                                                                                                                          TREATMENT DATE: 12/20  Established HEP for improved core and LLE strength (see bolded below)      PATIENT EDUCATION: Education details: Initial HEP, importance of more movement throughout the day, therapist to contact therapist who performed scooter evaluation to obtain denial letter. Encouraged pt to return to PT if balance declines, but pt's OM have improved since previous POC, so PT not justified at this time Person educated: Patient and Parent Education method: Explanation, Demonstration, and Handouts Education comprehension: verbalized understanding and returned demonstration  HOME EXERCISE PROGRAM: Access Code: [email or web address] URL: [email or web address] Date: 11/11/2023 Prepared by: Alethia Berthold Marvelous Bouwens  Exercises - Seated Anti-Rotation Press With Anchored Resistance  - 1 x daily - 7 x weekly - 3 sets - 10 reps - Seated march with opposite shoulder raise   - 1 x daily - 7 x weekly - 3 sets - 10 reps - Seated march over  - 1 x daily - 7 x weekly - 3 sets - 10 reps  GOALS: N/A  ASSESSMENT:  CLINICAL IMPRESSION: Patient is a 22 year old female referred to Neuro [facility] for gait abnormality.   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 The following deficits were present during the exam: decreased strength of LLE and decreased activity tolerance. At this time, pt does not  require skilled PT services as she is independent w/use of rollator. Pt is sedentary at home and mother requesting some exercises to work on core stability and endurance, which was provided during eval. Pt and mother in agreement that pt may return if balance declines, but pt is stable currently and does not require additional POC.    CLINICAL DECISION MAKING: Stable/uncomplicated  EVALUATION COMPLEXITY: Low  PLAN:  PT FREQUENCY: one time visit    Carola Viramontes E Faviola Klare, PT, DPT 11/11/2023, 1:37 PM

## 2023-11-14 ENCOUNTER — Telehealth: Payer: Self-pay | Admitting: Physical Therapy

## 2023-11-14 NOTE — Telephone Encounter (Signed)
Called and spoke to pt's mother, Alvis Lemmings, to inform her that [employer] will be sending a copy of the power scooter denial letter from 2022 per [name]'s request. [name] inquiring about needing a new LMN to obtain scooter, informed mother that therapist was unsure if this will be required for CAP and to ask worker at the program. Mother verbalized understanding and appreciation.   Jill Alexanders Wen Munford, PT, DPT

## 2023-11-24 ENCOUNTER — Ambulatory Visit: Payer: Medicaid Other | Admitting: Allergy & Immunology

## 2023-12-05 ENCOUNTER — Other Ambulatory Visit: Payer: Self-pay | Admitting: Allergy & Immunology

## 2023-12-30 ENCOUNTER — Other Ambulatory Visit: Payer: Self-pay

## 2023-12-30 NOTE — Progress Notes (Signed)
 Disenrolling patient due to compliance.

## 2024-01-11 ENCOUNTER — Other Ambulatory Visit (HOSPITAL_COMMUNITY): Payer: Self-pay

## 2024-01-11 ENCOUNTER — Other Ambulatory Visit: Payer: Self-pay

## 2024-01-11 NOTE — Progress Notes (Signed)
Specialty Pharmacy Refill Coordination Note  [name] is a 23 y.o. female contacted today regarding refills of specialty medication(s) Dupilumab (Dupixent) Spoke with patient's mother  Patient requested Delivery   Delivery date: 01/18/24   Verified address: Patient address [address]  [place] [address]   Medication will be filled on 02.25.25.

## 2024-01-18 ENCOUNTER — Other Ambulatory Visit: Payer: Self-pay

## 2024-01-18 NOTE — Progress Notes (Signed)
 01/18/24-CA: Dupixent  New delivery date is 01/19/24. Patient is aware of delay.

## 2024-02-08 ENCOUNTER — Other Ambulatory Visit: Payer: Self-pay

## 2024-02-09 ENCOUNTER — Other Ambulatory Visit (HOSPITAL_COMMUNITY): Payer: Self-pay

## 2024-02-15 ENCOUNTER — Other Ambulatory Visit: Payer: Self-pay

## 2024-02-21 ENCOUNTER — Other Ambulatory Visit (INDEPENDENT_AMBULATORY_CARE_PROVIDER_SITE_OTHER): Payer: Self-pay | Admitting: Neurology

## 2024-02-21 DIAGNOSIS — G252 Other specified forms of tremor: Secondary | ICD-10-CM

## 2024-02-21 DIAGNOSIS — R252 Cramp and spasm: Secondary | ICD-10-CM

## 2024-02-21 DIAGNOSIS — G931 Anoxic brain damage, not elsewhere classified: Secondary | ICD-10-CM

## 2024-02-23 ENCOUNTER — Other Ambulatory Visit (INDEPENDENT_AMBULATORY_CARE_PROVIDER_SITE_OTHER): Payer: Self-pay

## 2024-02-23 DIAGNOSIS — G252 Other specified forms of tremor: Secondary | ICD-10-CM

## 2024-02-23 DIAGNOSIS — R252 Cramp and spasm: Secondary | ICD-10-CM

## 2024-02-23 DIAGNOSIS — G931 Anoxic brain damage, not elsewhere classified: Secondary | ICD-10-CM

## 2024-04-03 ENCOUNTER — Telehealth: Payer: Self-pay | Admitting: Allergy & Immunology

## 2024-04-03 NOTE — Telephone Encounter (Signed)
 Left voicemail to give the office a call back to schedule Dupixent reapproval appointment.

## 2024-04-13 ENCOUNTER — Other Ambulatory Visit (HOSPITAL_COMMUNITY): Payer: Self-pay

## 2024-04-13 NOTE — Progress Notes (Signed)
 Patient has not filled her Dupixent  since 01/18/24 or returned any of our 4 contact attempts.  She has also missed her appointments with Asthma and Allergy and has no future appointments scheduled.  Disenrolling from Specialty Pharmacy services at this time.

## 2024-04-21 ENCOUNTER — Other Ambulatory Visit (INDEPENDENT_AMBULATORY_CARE_PROVIDER_SITE_OTHER): Payer: Self-pay | Admitting: Pediatrics

## 2024-04-21 DIAGNOSIS — G931 Anoxic brain damage, not elsewhere classified: Secondary | ICD-10-CM

## 2024-04-21 DIAGNOSIS — R252 Cramp and spasm: Secondary | ICD-10-CM

## 2024-04-21 DIAGNOSIS — G252 Other specified forms of tremor: Secondary | ICD-10-CM

## 2024-04-26 ENCOUNTER — Other Ambulatory Visit: Payer: Self-pay

## 2024-04-26 ENCOUNTER — Telehealth: Payer: Self-pay

## 2024-04-26 ENCOUNTER — Other Ambulatory Visit (HOSPITAL_COMMUNITY): Payer: Self-pay

## 2024-04-26 NOTE — Telephone Encounter (Signed)
 Patient's mother, [name] called in - DOB/NEED DPR  verified - requesting medication refill until patient's appt w/Dr. Idolina Maker.  Mom advised patient last seen: 10/18/23 return in 4 weeks  Office visit is needed for Dupixent  re-certification and medication refills - patient can see another provider as well.  Schedule appointment: 04/27/24 ! 1:45 pm w/ Dr. Jolayne Natter.  Mom verbalized understanding, no further questions.

## 2024-04-26 NOTE — Progress Notes (Signed)
 Patient mother called in requesting a refill. Due to compliance issues, asked if patient had missed any doses. Mother states she only missed 1 dose due to it being held while patient recovered from a "jaw issue". This does not appear to be the case based on fill history. Office telephone encounter in chart stating that patient needs re-approval appointment. Advised mom to call office first for re-approval and then we will need a new Rx and re-onboarding afterward. [name] will loop Tammy in as well.

## 2024-04-27 ENCOUNTER — Encounter: Payer: Self-pay | Admitting: Internal Medicine

## 2024-04-27 ENCOUNTER — Other Ambulatory Visit: Payer: Self-pay

## 2024-04-27 ENCOUNTER — Ambulatory Visit: Admitting: Internal Medicine

## 2024-04-27 VITALS — BP 128/78 | HR 83 | Temp 97.7°F | Resp 18 | Ht 65.0 in | Wt 226.8 lb

## 2024-04-27 DIAGNOSIS — L2084 Intrinsic (allergic) eczema: Secondary | ICD-10-CM

## 2024-04-27 DIAGNOSIS — J302 Other seasonal allergic rhinitis: Secondary | ICD-10-CM

## 2024-04-27 DIAGNOSIS — K219 Gastro-esophageal reflux disease without esophagitis: Secondary | ICD-10-CM

## 2024-04-27 DIAGNOSIS — J4550 Severe persistent asthma, uncomplicated: Secondary | ICD-10-CM | POA: Diagnosis not present

## 2024-04-27 DIAGNOSIS — L209 Atopic dermatitis, unspecified: Secondary | ICD-10-CM

## 2024-04-27 DIAGNOSIS — D84821 Immunodeficiency due to drugs: Secondary | ICD-10-CM

## 2024-04-27 DIAGNOSIS — J3089 Other allergic rhinitis: Secondary | ICD-10-CM

## 2024-04-27 DIAGNOSIS — Z7952 Long term (current) use of systemic steroids: Secondary | ICD-10-CM

## 2024-04-27 DIAGNOSIS — Z888 Allergy status to other drugs, medicaments and biological substances status: Secondary | ICD-10-CM

## 2024-04-27 DIAGNOSIS — T380X5A Adverse effect of glucocorticoids and synthetic analogues, initial encounter: Secondary | ICD-10-CM

## 2024-04-27 MED ORDER — SYMBICORT 160-4.5 MCG/ACT IN AERO: 2.0000 | INHALATION_SPRAY | Freq: Two times a day (BID) | RESPIRATORY_TRACT | 1 refills | Status: DC

## 2024-04-27 MED ORDER — MONTELUKAST SODIUM 10 MG PO TABS: 10.0000 mg | ORAL_TABLET | Freq: Every day | ORAL | 1 refills | Status: DC

## 2024-04-27 MED ORDER — LEVOCETIRIZINE DIHYDROCHLORIDE 5 MG PO TABS: 5.0000 mg | ORAL_TABLET | Freq: Every evening | ORAL | 1 refills | Status: DC

## 2024-04-27 MED ORDER — OMEPRAZOLE 20 MG PO CPDR: 20.0000 mg | DELAYED_RELEASE_CAPSULE | Freq: Every day | ORAL | 1 refills | Status: AC

## 2024-04-27 MED ORDER — FLOVENT HFA 110 MCG/ACT IN AERO: 2.0000 | INHALATION_SPRAY | Freq: Two times a day (BID) | RESPIRATORY_TRACT | 1 refills | Status: DC

## 2024-04-27 MED ORDER — BUDESONIDE 0.5 MG/2ML IN SUSP: RESPIRATORY_TRACT | 0 refills | Status: DC

## 2024-04-27 MED ORDER — PREDNISONE 10 MG PO TABS: ORAL_TABLET | ORAL | 0 refills | Status: DC

## 2024-04-27 MED ORDER — IPRATROPIUM-ALBUTEROL 0.5-2.5 (3) MG/3ML IN SOLN: 3.0000 mL | Freq: Four times a day (QID) | RESPIRATORY_TRACT | 5 refills | Status: AC | PRN

## 2024-04-27 MED ORDER — DUPILUMAB 300 MG/2ML ~~LOC~~ SOSY
600.0000 mg | PREFILLED_SYRINGE | Freq: Once | SUBCUTANEOUS | Status: AC
  Administered 2024-04-27: 600 mg via SUBCUTANEOUS

## 2024-04-27 MED ORDER — VENTOLIN HFA 108 (90 BASE) MCG/ACT IN AERS: 2.0000 | INHALATION_SPRAY | RESPIRATORY_TRACT | 0 refills | Status: DC | PRN

## 2024-04-27 NOTE — Progress Notes (Signed)
 FOLLOW UP Date of Service/Encounter:  04/27/24  Subjective:  [name] (DOB: 04/08/2001) is a 23 y.o. female who [facility] in re-evaluation of the following: severe persistent asthma, eczema on dupixent , allergic rhinoconjunctiivitis  History obtained from: chart review and patient and mother.  For Review, LV was on 10/18/23  with Dr. Idolina Maker seen for routine follow-up. See below for summary of history and diagnostics.   Today presents for follow-up. Discussed the use of AI scribe software for clinical note transcription with the patient, who gave verbal consent to proceed.  History of Present Illness [name] is a 23 year old female with asthma and eczema who presents with worsening asthma symptoms and eczema flare-ups.  She has a history of asthma and eczema. Recently, her asthma has been characterized by wheezing, which she attributes to her allergies. She is currently taking Symbicort  160/4.5 mcg and Flovent  110 mcg, both twice daily, along with Singulair  10 mg daily and Xyzal  5 mg daily. She has used a prednisone  taper once since her last visit for an asthma exacerbation. They keep an emergency prednisone  pack at home and need a refill.   Her eczema has been flaring up, with breakouts on the back of her neck and scalp. 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   Dupixent  has been effective in controlling her eczema in the past, better than Xolair . She uses topical treatments for her eczema, but they cause a burning sensation, especially when the skin is inflamed. Currently with eczematous patch on back of neck.   She experiences significant allergy symptoms, particularly in the spring, with eye watering and nasal congestion. She uses over-the-counter Benadryl for her eye symptoms. She refuses  to use nasal sprays due to the burning sensation they cause, preferring to tolerate nasal stuffiness instead. They have not tried eye drops   Socially, she limits her outdoor activities during high pollen seasons to avoid exacerbating her symptoms. She occasionally goes out with friends but avoids crowded places to prevent illness. No recent ER visits or urgent care for asthma exacerbations.     All medications reviewed by clinical staff and updated in chart. No new pertinent medical or surgical history except as noted in HPI.  ROS: All others negative except as noted per HPI.   Objective:  BP 128/78 (BP Location: Left Arm, Patient Position: Sitting, Cuff Size: Normal)   Pulse 83   Temp 97.7 F (36.5 C) (Temporal)   Resp 18   Ht 5\' 5"  (1.651 m)   Wt 226 lb 12.8 oz (102.9 kg)   SpO2 100%   BMI 37.74 kg/m  Body mass index is 37.74 kg/m. Physical Exam: General Appearance:  Alert, cooperative, no distress, appears stated age  Head:  Normocephalic, without obvious abnormality, atraumatic  Eyes:  Conjunctiva clear, EOM's intact  Ears EACs normal bilaterally  Nose: Nares normal, pale edematous nasal mucosa with clear rhinorrhea , hypertrophic turbinates, no visible anterior polyps, and septum midline  Throat: Lips, tongue normal; teeth and gums normal, + cobblestoning  Neck: Supple, symmetrical  Lungs:   clear to auscultation bilaterally, Respirations unlabored, no coughing  Heart:  regular rate and rhythm and no murmur, Appears well perfused  Extremities: No edema  Skin: erythematous, dry patches scattered on back of neck and Skin color, texture, turgor normal  Neurologic:  No gross deficits   Labs:  Lab Orders  No laboratory test(s) ordered today    Spirometry:  Tracings reviewed. Her effort: Good reproducible efforts. FVC: 3.13L FEV1: 2.15L, 65% predicted FEV1/FVC ratio: 69% Interpretation: Spirometry consistent with moderate obstructive disease.  Please see scanned  spirometry results for details.    Assessment/Plan   Patient Instructions  1. Allergic rhinoconjunctivitis - - Continue with the Singulair  10mg  daily.  - Continue with Xyzal  (levocetirizine) 5mg  tablet once daily.  -- Use Patanol eye drop first for eye symptoms    2. Eczema - Restart dupixent  300mg  every 2 weeks, repeat loading dose given today in clinic  - We will message tammy about renewing your prescription   - Continue with clobetasol  shampoo twice every other week like you are doing.  3. Severe persistent asthma  - Daily controller medication(s): Symbicort  160/4.5 two puffs twice daily with spacer + Flovent  110mcg two puffs twice daily  + Singulair  10mg  daily + Dupixent  every 2 weeks - Rescue medications: albuterol  4 puffs every 4-6 hours as needed, albuterol  nebulizer one vial puffs every 4-6 hours as needed, or DuoNeb nebulizer one vial every 4-6 hours as needed - Changes during respiratory infections or worsening symptoms: Add on Pulmicort  0.5 mg nebulizer to THREE TREATMENTS three times daily for TWO WEEKS. - Asthma control goals:  * Full participation in all desired activities (may need albuterol  before activity) * Albuterol  use two time or less a week on average (not counting use with activity) * Cough interfering with sleep two time or less a month * Oral steroids no more than once a year * No hospitalizations  4. GERD - Continue with omeprazole  40mg  in the morning.      Follow up: 3 months  Thank you so much for letting me partake in your care today.  Don't hesitate to reach out if you have any additional concerns!  Orelia Binet, MD  [facility] [place], [place]     Other: samples provided of: dupixent  , reviewed spirometry technique, and reviewed inhaler technique  Thank you so much for letting me partake in your care today.  Don't hesitate to reach out if you have any additional concerns!  Orelia Binet, MD  [facility]  [place], [place]

## 2024-04-27 NOTE — Patient Instructions (Addendum)
 1. Allergic rhinoconjunctivitis - - Continue with the Singulair  10mg  daily.  - Continue with Xyzal  (levocetirizine) 5mg  tablet once daily.  -- Use Patanol eye drop first for eye symptoms    2. Eczema - Restart dupixent  300mg  every 2 weeks, repeat loading dose given today in clinic  - We will message tammy about renewing your prescription   - Continue with clobetasol  shampoo twice every other week like you are doing.  3. Severe persistent asthma  - Daily controller medication(s): Symbicort  160/4.5 two puffs twice daily with spacer + Flovent  110mcg two puffs twice daily  + Singulair  10mg  daily + Dupixent  every 2 weeks - Rescue medications: albuterol  4 puffs every 4-6 hours as needed, albuterol  nebulizer one vial puffs every 4-6 hours as needed, or DuoNeb nebulizer one vial every 4-6 hours as needed - Changes during respiratory infections or worsening symptoms: Add on Pulmicort  0.5 mg nebulizer to THREE TREATMENTS three times daily for TWO WEEKS. - Asthma control goals:  * Full participation in all desired activities (may need albuterol  before activity) * Albuterol  use two time or less a week on average (not counting use with activity) * Cough interfering with sleep two time or less a month * Oral steroids no more than once a year * No hospitalizations  4. GERD - Continue with omeprazole  40mg  in the morning.      Follow up: 3 months  Thank you so much for letting me partake in your care today.  Don't hesitate to reach out if you have any additional concerns!  Orelia Binet, MD  [facility] [place], [place]

## 2024-04-28 ENCOUNTER — Other Ambulatory Visit: Payer: Self-pay | Admitting: Allergy & Immunology

## 2024-04-30 ENCOUNTER — Telehealth: Payer: Self-pay | Admitting: *Deleted

## 2024-04-30 NOTE — Telephone Encounter (Signed)
 Obtained new approval with [facility] and called and l/m for mother to contact Melodee Spruce they wont reach back out to her due to multiple attempts to reach her

## 2024-04-30 NOTE — Telephone Encounter (Signed)
-----   Message from Orelia Binet sent at 04/27/2024  3:05 PM EDT ----- Patients dupixent  prescription fell off.  I reloaded her today with a sample, but can we get her back on?  Thank you !

## 2024-05-01 NOTE — Telephone Encounter (Signed)
 Okay, looping Dr. Idolina Maker in as this is his patient and and a high risk asthmatic.

## 2024-05-02 NOTE — Telephone Encounter (Signed)
 Refill for famotidine  40 mg x 90 day supply with 1 refill (ok per Dr. Jolayne Natter) sent to [facility].

## 2024-05-03 NOTE — Telephone Encounter (Signed)
 Thanks for the update! I think she missed her last appointment with me.   Drexel Gentles, MD [facility] 

## 2024-05-07 ENCOUNTER — Ambulatory Visit (INDEPENDENT_AMBULATORY_CARE_PROVIDER_SITE_OTHER): Payer: Medicaid Other | Admitting: Neurology

## 2024-05-09 ENCOUNTER — Other Ambulatory Visit: Payer: Self-pay

## 2024-05-09 NOTE — Progress Notes (Signed)
 Specialty Pharmacy Initial Fill Coordination Note  [name] is a 23 y.o. female contacted today regarding initial fill of specialty medication(s) Dupilumab  (Dupixent )   Patient requested Delivery   Delivery date: 05/11/24   Verified address: Patient address [address]  [place] [address]   Medication will be filled on 05/10/24.   Patient is aware of $0 copayment.

## 2024-05-09 NOTE — Progress Notes (Signed)
 Specialty Pharmacy Initiation Note   [name] is a 23 y.o. female who will be followed by the specialty pharmacy service for RxSp Atopic Dermatitis    Review of administration, indication, effectiveness, safety, potential side effects, storage/disposable, and missed dose instructions occurred today for patient's specialty medication(s) Dupilumab  (Dupixent )     Patient/Caregiver did not have any additional questions or concerns.   Patient's therapy is appropriate to: Initiate    Goals Addressed             This Visit's Progress    Reduce signs and symptoms       Patient is re-initiating therapy. Patient will work on increased adherence. Patient was previously maintained on therapy but had to discontinue due to dental issues per mother.          Alvan Culpepper M Aleecia Tapia Specialty Pharmacist

## 2024-05-10 ENCOUNTER — Other Ambulatory Visit (HOSPITAL_COMMUNITY): Payer: Self-pay

## 2024-05-10 ENCOUNTER — Other Ambulatory Visit: Payer: Self-pay

## 2024-05-10 NOTE — Progress Notes (Signed)
 Attempted to leave VM. [employer] voice answered and disconnected the call twice. New delivery date 05/15/24.

## 2024-05-11 ENCOUNTER — Other Ambulatory Visit: Payer: Self-pay

## 2024-05-14 ENCOUNTER — Other Ambulatory Visit: Payer: Self-pay

## 2024-05-16 ENCOUNTER — Other Ambulatory Visit (INDEPENDENT_AMBULATORY_CARE_PROVIDER_SITE_OTHER): Payer: Self-pay | Admitting: Neurology

## 2024-05-16 DIAGNOSIS — M62830 Muscle spasm of back: Secondary | ICD-10-CM

## 2024-05-16 DIAGNOSIS — R252 Cramp and spasm: Secondary | ICD-10-CM

## 2024-05-16 DIAGNOSIS — G252 Other specified forms of tremor: Secondary | ICD-10-CM

## 2024-05-16 DIAGNOSIS — R269 Unspecified abnormalities of gait and mobility: Secondary | ICD-10-CM

## 2024-05-16 DIAGNOSIS — G931 Anoxic brain damage, not elsewhere classified: Secondary | ICD-10-CM

## 2024-05-16 MED ORDER — LEVETIRACETAM 250 MG PO TABS: 250.0000 mg | ORAL_TABLET | Freq: Two times a day (BID) | ORAL | 2 refills | Status: DC

## 2024-05-16 MED ORDER — BACLOFEN 20 MG PO TABS: 20.0000 mg | ORAL_TABLET | Freq: Three times a day (TID) | ORAL | 2 refills | Status: DC

## 2024-05-16 MED ORDER — GABAPENTIN 300 MG PO CAPS: 300.0000 mg | ORAL_CAPSULE | Freq: Three times a day (TID) | ORAL | 2 refills | Status: DC

## 2024-05-16 MED ORDER — CLONIDINE HCL 0.1 MG PO TABS: ORAL_TABLET | ORAL | 2 refills | Status: DC

## 2024-05-16 NOTE — Telephone Encounter (Signed)
  Name of who is calling: dawn  Caller's Relationship to Patient: mother   Best contact [phone]  Provider they see:  Reason for call: rx refill on all meds said been calling since last week, haven't had any update on it. Please give mom a call     PRESCRIPTION REFILL ONLY  Name of prescription:  Pharmacy:

## 2024-05-16 NOTE — Telephone Encounter (Signed)
 Called mom to see which medication she is taking and needed to be refilled so I can make sure I am sending over the correct medication. Mom gave me the list of meds and I will be sending it over to the pharmacy. It was only one controlled substance which was clonazepam  and I will send the med refill over to Dr. Jenney  Mom understood message

## 2024-05-16 NOTE — Addendum Note (Signed)
 Addended by: CHARLOTT EDYTH HERO on: 05/16/2024 02:29 PM   Modules accepted: Orders

## 2024-05-17 ENCOUNTER — Other Ambulatory Visit (INDEPENDENT_AMBULATORY_CARE_PROVIDER_SITE_OTHER): Payer: Self-pay | Admitting: Neurology

## 2024-05-17 DIAGNOSIS — R252 Cramp and spasm: Secondary | ICD-10-CM

## 2024-05-17 DIAGNOSIS — G252 Other specified forms of tremor: Secondary | ICD-10-CM

## 2024-05-17 DIAGNOSIS — G931 Anoxic brain damage, not elsewhere classified: Secondary | ICD-10-CM

## 2024-05-17 MED ORDER — CLONAZEPAM 0.5 MG PO TABS: 1.0000 mg | ORAL_TABLET | Freq: Two times a day (BID) | ORAL | 0 refills | Status: DC

## 2024-05-17 NOTE — Telephone Encounter (Signed)
 Sent refill to provider yesterday. No need to resend

## 2024-05-23 ENCOUNTER — Other Ambulatory Visit: Payer: Self-pay

## 2024-05-24 ENCOUNTER — Other Ambulatory Visit (HOSPITAL_COMMUNITY): Payer: Self-pay

## 2024-05-30 ENCOUNTER — Ambulatory Visit (INDEPENDENT_AMBULATORY_CARE_PROVIDER_SITE_OTHER): Payer: Self-pay | Admitting: Neurology

## 2024-05-30 ENCOUNTER — Encounter (INDEPENDENT_AMBULATORY_CARE_PROVIDER_SITE_OTHER): Payer: Self-pay | Admitting: Neurology

## 2024-05-30 VITALS — BP 122/76 | HR 72 | Ht 64.96 in | Wt 222.6 lb

## 2024-05-30 DIAGNOSIS — Z72821 Inadequate sleep hygiene: Secondary | ICD-10-CM | POA: Diagnosis not present

## 2024-05-30 DIAGNOSIS — G252 Other specified forms of tremor: Secondary | ICD-10-CM

## 2024-05-30 DIAGNOSIS — R252 Cramp and spasm: Secondary | ICD-10-CM

## 2024-05-30 DIAGNOSIS — R269 Unspecified abnormalities of gait and mobility: Secondary | ICD-10-CM

## 2024-05-30 DIAGNOSIS — G931 Anoxic brain damage, not elsewhere classified: Secondary | ICD-10-CM | POA: Diagnosis not present

## 2024-05-30 DIAGNOSIS — G253 Myoclonus: Secondary | ICD-10-CM

## 2024-05-30 DIAGNOSIS — M62830 Muscle spasm of back: Secondary | ICD-10-CM

## 2024-05-30 MED ORDER — BACLOFEN 20 MG PO TABS: 20.0000 mg | ORAL_TABLET | Freq: Three times a day (TID) | ORAL | 8 refills | Status: AC

## 2024-05-30 MED ORDER — GABAPENTIN 300 MG PO CAPS: 300.0000 mg | ORAL_CAPSULE | Freq: Three times a day (TID) | ORAL | 8 refills | Status: AC

## 2024-05-30 MED ORDER — CLONIDINE HCL 0.1 MG PO TABS: ORAL_TABLET | ORAL | 8 refills | Status: AC

## 2024-05-30 MED ORDER — LEVETIRACETAM 250 MG PO TABS: 250.0000 mg | ORAL_TABLET | Freq: Two times a day (BID) | ORAL | 8 refills | Status: AC

## 2024-05-30 MED ORDER — CLONAZEPAM 0.5 MG PO TABS: 1.0000 mg | ORAL_TABLET | Freq: Two times a day (BID) | ORAL | 5 refills | Status: DC

## 2024-05-30 NOTE — Patient Instructions (Signed)
 Will continue the same dose of medications for now I will send a referral to movement specialist Continue with the same dose of medications Try to sleep at a specific time every night with no electronic at bedtime Do not take a nap during the day Return in 8 months for follow-up visit

## 2024-05-30 NOTE — Progress Notes (Addendum)
 Patient: [name] MRN: [id number] Sex: female DOB: 12-24-2000  Provider: Norwood Abu, MD Location of Care: [facility]  Note type: Routine return visit  Referral Source: Morgan Perl, MD History from: patient, [facility] chart, and Mom Chief Complaint: Gait Disorder  History of Present Illness: [name] is a 23 y.o. female is here for follow-up management of her neurological issues. 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 She never had any confirmed seizure but initially she was on Keppra  for seizure and then it was continued by a movement specialist at [facility] to help with dystonic and myoclonic movements along with the clonazepam . She was having significant difficulty and not able to sleep through the night even with trazodone  so it was switched to clonidine  to help with sleep and occasionally she may stay awake for 2 or 3 days in a row. She is not able to walk independently due to having significant shaking, tremor and unusual movements, with some subjective feeling of weakness.  And also she might have some difficulty with her vision with some dizziness and lightheadedness as well. Overall since her last visit in October 2024 she has been the same without any significant improvement or worsening and on her last visit she was recommended to follow-up with movement specialist at [facility] or with the sleep specialist but mother was not able to schedule any appointment with other specialist.  She was seen by ophthalmologist and had an eye exam.  Review of Systems: Review of system as per HPI, otherwise negative.  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     Surgical History Past Surgical History:  Procedure Laterality Date   ADENOIDECTOMY     ESOPHAGOGASTRODUODENOSCOPY (EGD) WITH PROPOFOL  N/A 09/27/2017   Procedure: ESOPHAGOGASTRODUODENOSCOPY (EGD) WITH PROPOFOL ;  Surgeon: Raiford Ade, MD;  Location: [facility];  Service: Gastroenterology;  Laterality: N/A;   PEG PLACEMENT N/A 09/27/2017   Procedure: PERCUTANEOUS ENDOSCOPIC GASTROSTOMY (PEG) PLACEMENT;  Surgeon: Raiford Ade, MD;  Location: [facility] ENDOSCOPY;  Service: Gastroenterology;  Laterality: N/A;   TONSILLECTOMY     WISDOM TOOTH EXTRACTION      Family History family history includes Asthma in her mother and paternal grandmother; Cancer in her maternal grandfather, mother, and paternal grandmother; Depression in her brother; Diabetes in her maternal grandfather and maternal grandmother; Hypertension in her maternal grandfather and maternal grandmother; Vision loss in her mother.  Social History Social History   Socioeconomic History   Marital status: Single    Spouse name: Not on file   Number of children: Not on file   Years of education: Not on file   Highest education level: Not on file  Occupational History   Not on  file  Tobacco Use   Smoking status: Never    Passive exposure: Never   Smokeless tobacco: Never   Tobacco comments:    no smokers in the home  Vaping Use   Vaping status: Never Used  Substance and Sexual Activity   Alcohol use: No   Drug use: No   Sexual activity: Never    Comment: Metformin  for irregular menstrual cycles  Other Topics  Concern   Not on file  Social History Narrative   Patient lives at home with mom, dad.    Enjoys singing, drawing and doing make up   Social Drivers of Health   Financial Resource Strain: Low Risk  (04/30/2024)   Received from [facility]   Overall Financial Resource Strain (CARDIA)    Difficulty of Paying Living Expenses: Not very hard  Food Insecurity: No Food Insecurity (04/30/2024)   Received from [facility]   Hunger Vital Sign    Within the past 12 months, you worried that your food would run out before you got the money to buy more.: Never true    Within the past 12 months, the food you bought just didn't last and you didn't have money to get more.: Never true  Transportation Needs: No Transportation Needs (04/30/2024)   Received from [facility]   PRAPARE - Transportation    Lack of Transportation (Medical): No    Lack of Transportation (Non-Medical): No  Physical Activity: Unknown (04/30/2024)   Received from [facility]   Exercise Vital Sign    On average, how many days per week do you engage in moderate to strenuous exercise (like a brisk walk)?: 0 days    Minutes of Exercise per Session: Not on file  Stress: Stress Concern Present (04/30/2024)   Received from [facility] Occupational Health - Occupational Stress Questionnaire    Feeling of Stress : To some extent  Social Connections: Socially Isolated (04/30/2024)   Received from [facility]   Social Network    How would you rate your social network (family, work, friends)?: Little participation, lonely and socially isolated     Allergies  Allergen Reactions   Azithromycin Anaphylaxis   Biaxin [Clarithromycin] Anaphylaxis   Erythromycin Anaphylaxis   Macrolides And Ketolides Anaphylaxis   Nitrofurantoin Monohyd Macro Anaphylaxis   Quinolones Anaphylaxis   Troleandomycin Anaphylaxis   Mango Flavoring Agent (Non-Screening) Hives    Physical Exam BP 122/76   Pulse 72   Ht 5' 4.96  (1.65 m)   Wt 222 lb 9.6 oz (101 kg)   BMI 37.09 kg/m  Gen: Awake, alert, not in distress,  Skin: No neurocutaneous stigmata, no rash HEENT: Normocephalic, no dysmorphic features, no conjunctival injection, nares patent, mucous membranes moist, oropharynx clear. Neck: Supple, no meningismus, no lymphadenopathy,  Resp: Clear to auscultation bilaterally CV: Regular rate, normal S1/S2, no murmurs, no rubs Abd: Bowel sounds present, abdomen soft, non-tender, non-distended.  No hepatosplenomegaly or mass. Ext: Warm and well-perfused. No deformity, no muscle wasting, ROM full.  Neurological Examination: MS- Awake, alert, interactive Cranial Nerves- Pupils equal, round and reactive to light (5 to 3mm); fix and follows with full and smooth EOM; no nystagmus; no ptosis, funduscopy was not performed, visual field full by looking at the toys on the side, face symmetric with smile.  Hearing intact to bell bilaterally, palate elevation is symmetric,. Tone- Normal Strength-Seems to have good strength, symmetrically by observation and passive movement. Reflexes-    Biceps Triceps Brachioradialis Patellar  Ankle  R 2+ 2+ 2+ 2+ 2+  L 2+ 2+ 2+ 2+ 2+   Plantar responses flexor bilaterally, no clonus noted Sensation- Withdraw at four limbs to stimuli. Coordination- Reached to the object with no dysmetria but with a slight tremor Gait: Able to stand on her feet and stepped forward with some help or with walker   Assessment and Plan 1. Spasticity   2. Anoxic brain injury (HCC)   3. Intention tremor   4. Neurologic gait disorder   5. Action induced myoclonus   6. Poor sleep hygiene   7. Muscle spasm of back    This is a 23 year old female with history of anoxic brain injury, cardiac arrest with some degree of quadriparesis and difficulty with ambulation although without having any significant spasticity on exam, has been on multiple different medications as below with a fairly stable exam over the  past year.  She has no new findings on today's exam. At this time I do not want to change any of the medications since patient and her mother both would be resistant on tapering or discontinuing any of these medications. On her last visit I was going to discontinue Keppra  but mother would like to continue the medication based on the recommendation from movement specialist at [facility] that it may help with her movements. Also she would like to continue other medications including gabapentin , baclofen , clonidine  and clonazepam  that has helped her with her condition and decreasing the dose of any of this medication would cause more abnormal movement and shaking which I think part of that would be dependency to some of these medications. I discussed with mother that she may benefit from seeing an adult movement specialist to evaluate if she needs to be on all of these medications and if there is any adjustment of medication needed.  I will send a referral to adult neurology at [facility]. Also I think she needs to be seen by sleep specialist at a later time to discuss her irregular circadian rhythm and if there is any specific medication needed in addition to sleep hygiene. No further neurological testing needed at this time Mother would like to have a type of walker or scooter to help with her ambulation and will try to see what would be the best to order since she does have significant difficulty with ambulation without assistance. I would schedule an appointment for about 8 months to reevaluate her but if she continues follow-up with adult neurology then no follow-up visit with pediatric neurology needed.  She and her mother understood and agreed with the plan.  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  Meds ordered this encounter  Medications   levETIRAcetam  (KEPPRA ) 250 MG tablet    Sig: Take 1 tablet (250 mg total) by mouth 2 (two) times daily.     Dispense:  60 tablet    Refill:  8   gabapentin  (NEURONTIN ) 300 MG capsule    Sig: Take 1 capsule (300 mg total) by mouth 3 (three) times daily.    Dispense:  90 capsule    Refill:  8   baclofen  (LIORESAL ) 20 MG tablet    Sig: Take 1 tablet (20 mg total) by mouth 3 (three) times daily.    Dispense:  90 tablet    Refill:  8   cloNIDine  (CATAPRES ) 0.1 MG tablet    Sig: Take 1 tablet in a.m. and 2 tablets in p.m.  Dispense:  90 tablet    Refill:  8   clonazePAM  (KLONOPIN ) 0.5 MG tablet    Sig: Take 2 tablets (1 mg total) by mouth 2 (two) times daily.    Dispense:  124 tablet    Refill:  5   No orders of the defined types were placed in this encounter.

## 2024-06-04 ENCOUNTER — Other Ambulatory Visit: Payer: Self-pay

## 2024-06-05 ENCOUNTER — Other Ambulatory Visit (INDEPENDENT_AMBULATORY_CARE_PROVIDER_SITE_OTHER): Payer: Self-pay | Admitting: Family

## 2024-06-05 DIAGNOSIS — G931 Anoxic brain damage, not elsewhere classified: Secondary | ICD-10-CM

## 2024-06-05 DIAGNOSIS — R252 Cramp and spasm: Secondary | ICD-10-CM

## 2024-06-05 DIAGNOSIS — R269 Unspecified abnormalities of gait and mobility: Secondary | ICD-10-CM

## 2024-06-05 DIAGNOSIS — G8194 Hemiplegia, unspecified affecting left nondominant side: Secondary | ICD-10-CM

## 2024-06-05 NOTE — Progress Notes (Unsigned)
 I called [name]'s CAP manager [name] @ [phone] to ask about a medical scooter. She recommended sending an order to a DME agency. If denied by Medicaid, her CAP funds may pay for the scooter.

## 2024-06-06 ENCOUNTER — Other Ambulatory Visit: Payer: Self-pay

## 2024-06-06 ENCOUNTER — Other Ambulatory Visit: Payer: Self-pay | Admitting: Pharmacy Technician

## 2024-06-06 NOTE — Progress Notes (Signed)
 Specialty Pharmacy Refill Coordination Note  [name] is a 23 y.o. female contacted today regarding refills of specialty medication(s) Dupilumab  (Dupixent )   Patient requested Delivery   Delivery date: 06/07/24   Verified address: [address]  [place] [address]   Medication will be filled on 06/06/24.   Spoke to patient's mother.

## 2024-06-25 ENCOUNTER — Encounter (INDEPENDENT_AMBULATORY_CARE_PROVIDER_SITE_OTHER): Admitting: Ophthalmology

## 2024-06-25 DIAGNOSIS — H43813 Vitreous degeneration, bilateral: Secondary | ICD-10-CM | POA: Diagnosis not present

## 2024-06-25 DIAGNOSIS — H33012 Retinal detachment with single break, left eye: Secondary | ICD-10-CM

## 2024-06-29 ENCOUNTER — Other Ambulatory Visit: Payer: Self-pay

## 2024-06-29 NOTE — Progress Notes (Signed)
 Specialty Pharmacy Refill Coordination Note  [name] is a 23 y.o. female contacted today regarding refills of specialty medication(s) Dupilumab  (Dupixent )   Patient requested Delivery   Delivery date: 07/05/24   Verified address: [address]  [name] [address]   Medication will be filled on 07/04/24.

## 2024-07-04 ENCOUNTER — Other Ambulatory Visit (HOSPITAL_COMMUNITY): Payer: Self-pay

## 2024-07-04 ENCOUNTER — Other Ambulatory Visit: Payer: Self-pay

## 2024-07-09 ENCOUNTER — Encounter (INDEPENDENT_AMBULATORY_CARE_PROVIDER_SITE_OTHER): Admitting: Ophthalmology

## 2024-07-09 DIAGNOSIS — H43813 Vitreous degeneration, bilateral: Secondary | ICD-10-CM

## 2024-07-09 DIAGNOSIS — H33302 Unspecified retinal break, left eye: Secondary | ICD-10-CM | POA: Diagnosis not present

## 2024-07-19 ENCOUNTER — Telehealth (INDEPENDENT_AMBULATORY_CARE_PROVIDER_SITE_OTHER): Payer: Self-pay | Admitting: Neurology

## 2024-07-19 NOTE — Telephone Encounter (Signed)
 Called to inform them that I will be sending over the updated clinical notes. Couldn't get in touch with anyone

## 2024-07-19 NOTE — Telephone Encounter (Signed)
  Name of who is calling: christian   Caller's Relationship to Patient: [employer]   Best contact number: [phone]  Provider they see: [facility]   Reason for call: [employer] forms filled signed and faxed back to [phone]  Chart notes and renewal auth per insurance    PRESCRIPTION REFILL ONLY  Name of prescription:  Pharmacy:

## 2024-07-26 ENCOUNTER — Telehealth (INDEPENDENT_AMBULATORY_CARE_PROVIDER_SITE_OTHER): Payer: Self-pay | Admitting: Neurology

## 2024-07-26 NOTE — Telephone Encounter (Signed)
   Name of who is calling: christian    Caller's Relationship to Patient: [employer]    Best contact number: [phone]   Provider they see: [facility]    Reason for call: [employer] called again stated the forms did not say that the patient is incontinent ? She would like a call back regarding this

## 2024-07-27 ENCOUNTER — Other Ambulatory Visit: Payer: Self-pay

## 2024-07-27 NOTE — Telephone Encounter (Signed)
 Called [name] back from [employer] and informed to her that the last office note I sent had the addendum on page 7 of my last fax to them. She saw it and said she would update it on her end and if she needed anything else she would contact me.  We both understood the message

## 2024-07-30 ENCOUNTER — Other Ambulatory Visit: Payer: Self-pay

## 2024-07-30 NOTE — Progress Notes (Signed)
 Specialty Pharmacy Refill Coordination Note  Spoke to patient's mother, [name] is a 23 y.o. female contacted today regarding refills of specialty medication(s) Dupilumab  (Dupixent )   Patient requested Delivery   Delivery date: 08/01/24   Verified address: [address]  [place] [address]   Medication will be filled on 07/31/24.

## 2024-07-31 ENCOUNTER — Ambulatory Visit: Admitting: Allergy & Immunology

## 2024-08-21 ENCOUNTER — Telehealth: Payer: Self-pay | Admitting: Internal Medicine

## 2024-08-21 ENCOUNTER — Ambulatory Visit: Admitting: Allergy & Immunology

## 2024-08-21 MED ORDER — VENTOLIN HFA 108 (90 BASE) MCG/ACT IN AERS: 2.0000 | INHALATION_SPRAY | RESPIRATORY_TRACT | 2 refills | Status: AC | PRN

## 2024-08-21 NOTE — Addendum Note (Signed)
 Addended by: Trinitee Horgan E on: 08/21/2024 05:20 PM   Modules accepted: Orders

## 2024-08-21 NOTE — Telephone Encounter (Signed)
 Mother called and stated that [name] needs a refill for an albuterol  inhaler. I do not see this on her med list, but mom states that she has had one and needs it refilled. They use [facility] in [place]

## 2024-08-23 ENCOUNTER — Other Ambulatory Visit: Payer: Self-pay

## 2024-08-27 ENCOUNTER — Other Ambulatory Visit: Payer: Self-pay

## 2024-08-30 ENCOUNTER — Other Ambulatory Visit: Payer: Self-pay | Admitting: Allergy & Immunology

## 2024-08-30 ENCOUNTER — Other Ambulatory Visit: Payer: Self-pay

## 2024-08-30 MED ORDER — DUPIXENT 300 MG/2ML ~~LOC~~ SOSY
300.0000 mg | PREFILLED_SYRINGE | SUBCUTANEOUS | 11 refills | Status: AC
  Filled 2024-08-30: qty 4, 28d supply, fill #0
  Filled 2024-09-24: qty 4, 28d supply, fill #1
  Filled 2024-10-30 – 2024-10-31 (×2): qty 4, 28d supply, fill #2

## 2024-08-30 NOTE — Progress Notes (Signed)
 Specialty Pharmacy Refill Coordination Note  I spoke with the patient's mother. [name] is a 23 y.o. female contacted today regarding refills of specialty medication(s) Dupilumab  (Dupixent )   Patient requested Delivery   Delivery date: 09/04/24   Verified address: [address]  [place] [address]   Medication will be filled on 09/03/24. This fill date is pending response to refill request from provider. Patient is aware and if they have not received fill by intended date they must follow up with pharmacy.

## 2024-09-03 ENCOUNTER — Other Ambulatory Visit: Payer: Self-pay

## 2024-09-04 ENCOUNTER — Other Ambulatory Visit: Payer: Self-pay

## 2024-09-04 ENCOUNTER — Encounter: Payer: Self-pay | Admitting: Allergy & Immunology

## 2024-09-04 ENCOUNTER — Ambulatory Visit (INDEPENDENT_AMBULATORY_CARE_PROVIDER_SITE_OTHER): Admitting: Allergy & Immunology

## 2024-09-04 VITALS — BP 110/78 | HR 99 | Temp 97.3°F | Resp 16 | Ht 65.0 in | Wt 209.4 lb

## 2024-09-04 DIAGNOSIS — L2084 Intrinsic (allergic) eczema: Secondary | ICD-10-CM

## 2024-09-04 DIAGNOSIS — J3089 Other allergic rhinitis: Secondary | ICD-10-CM

## 2024-09-04 DIAGNOSIS — J4550 Severe persistent asthma, uncomplicated: Secondary | ICD-10-CM | POA: Diagnosis not present

## 2024-09-04 DIAGNOSIS — J302 Other seasonal allergic rhinitis: Secondary | ICD-10-CM | POA: Diagnosis not present

## 2024-09-04 MED ORDER — EPIPEN 2-PAK 0.3 MG/0.3ML IJ SOAJ: 0.3000 mg | INTRAMUSCULAR | 1 refills | Status: AC | PRN

## 2024-09-04 MED ORDER — LEVOCETIRIZINE DIHYDROCHLORIDE 5 MG PO TABS: 5.0000 mg | ORAL_TABLET | Freq: Every evening | ORAL | 1 refills | Status: AC

## 2024-09-04 MED ORDER — SYMBICORT 160-4.5 MCG/ACT IN AERO: 2.0000 | INHALATION_SPRAY | Freq: Two times a day (BID) | RESPIRATORY_TRACT | 1 refills | Status: AC

## 2024-09-04 MED ORDER — MONTELUKAST SODIUM 10 MG PO TABS: 10.0000 mg | ORAL_TABLET | Freq: Every day | ORAL | 1 refills | Status: AC

## 2024-09-04 MED ORDER — FLOVENT HFA 110 MCG/ACT IN AERO: 2.0000 | INHALATION_SPRAY | Freq: Two times a day (BID) | RESPIRATORY_TRACT | 1 refills | Status: AC

## 2024-09-04 MED ORDER — BUDESONIDE 0.5 MG/2ML IN SUSP: RESPIRATORY_TRACT | 0 refills | Status: AC

## 2024-09-04 MED ORDER — CETIRIZINE HCL 10 MG PO TABS: 10.0000 mg | ORAL_TABLET | Freq: Every day | ORAL | 1 refills | Status: AC

## 2024-09-04 NOTE — Patient Instructions (Addendum)
 1. Allergic rhinoconjunctivitis - with overlying left sided ear infection  - Continue with the Singulair  10mg  daily.  - Continue with Xyzal  (levocetirizine) 5mg  tablet once daily.   2. Eczema - Continue with Dupixent  every couple of weeks.  - Continue with clobetasol  shampoo twice every other week like you are doing.  3. Severe persistent asthma, uncomplicated - Lung testing looked a bit lower today.  - But she is largely doing better with her decreased steroid d helped.  She does not need refills of any of her steroids.  oses. - Daily controller medication(s): Symbicort  160/4.5 two puffs twice daily with spacer + Flovent  110mcg two puffs twice daily  + Singulair  10mg  daily + Dupixent  every 2 weeks - Rescue medications: albuterol  4 puffs every 4-6 hours as needed, albuterol  nebulizer one vial puffs every 4-6 hours as needed, or DuoNeb nebulizer one vial every 4-6 hours as needed - Changes during respiratory infections or worsening symptoms: Add on Pulmicort  0.5 mg nebulizer to THREE TREATMENTS three times daily for TWO WEEKS. - Asthma control goals:  * Full participation in all desired activities (may need albuterol  before activity) * Albuterol  use two time or less a week on average (not counting use with activity) * Cough interfering with sleep two time or less a month * Oral steroids no more than once a year * No hospitalizations  4. GERD - Continue with omeprazole  40mg  in the morning.      5. Return in about 6 months (around 03/05/2025). You can have the follow up appointment with Dr. Iva or a Nurse Practicioner (our Nurse Practitioners are excellent and always have Physician oversight!).    Please inform us  of any Emergency Department visits, hospitalizations, or changes in symptoms. Call us  before going to the ED for breathing or allergy symptoms since we might be able to fit you in for a sick visit. Feel free to contact us  anytime with any questions, problems, or concerns.  It  was a pleasure to see you and your family again today!  Websites that have reliable patient information: 1. American Academy of Asthma, Allergy, and Immunology: www.aaaai.org 2. Food Allergy Research and Education (FARE): foodallergy.org 3. Mothers of Asthmatics: http://www.asthmacommunitynetwork.org 4. American College of Allergy, Asthma, and Immunology: www.acaai.org      "Like" us  on Facebook and Instagram for our latest updates!      A healthy democracy works best when [employer] participate! Make sure you are registered to vote! If you have moved or changed any of your contact information, you will need to get this updated before voting! Scan the QR codes below to learn more!

## 2024-09-04 NOTE — Progress Notes (Signed)
 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 Allergic rhinoconjunctivitis - with overlying left sided ear infection  - Continue with the Singulair  10mg  daily.  - Continue with Xyzal  (levocetirizine) 5mg  tablet once daily.   2. Eczema - Continue with Dupixent  every couple of weeks.  - Continue with clobetasol  shampoo twice every other week like you are doing.  3. Severe persistent asthma, uncomplicated - Lung testing looked a bit lower today.  - But she is largely doing better with her decreased steroid doses. - Daily controller medication(s): Symbicort  160/4.5 two puffs twice daily with spacer + Flovent  110mcg two puffs twice daily  + Singulair  10mg  daily + Dupixent  every 2 weeks - Rescue medications: albuterol  4 puffs every 4-6 hours as needed, albuterol  nebulizer one vial puffs every 4-6 hours as needed, or DuoNeb nebulizer one vial every 4-6 hours as needed - Changes during respiratory infections or worsening symptoms: Add on Pulmicort  0.5 mg nebulizer to THREE TREATMENTS three times daily for TWO WEEKS. - Asthma control goals:  * Full participation in all desired activities (may need albuterol  before activity) * Albuterol  use two time or less a week on average (not counting use with activity) * Cough interfering with sleep two time or less a month * Oral steroids no more than once a year * No hospitalizations  4. GERD - Continue with omeprazole  40mg  in the morning.      5. Return in about 6 months (around 03/05/2025). You can have the  follow up appointment with Dr. Iva or a Nurse Practicioner (our Nurse Practitioners are excellent and always have Physician oversight!).   Subjective:   [name] is a 23 y.o. 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  Discussed the use of AI scribe software for clinical note transcription with the patient and/or guardian, who gave verbal consent to proceed.  [name] is a 23 y.o. female presenting for a follow up visit.  She was last seen in June 2025 by Dr. Lorin.  At that time, she was continued on Singulair  as well  as Xyzal  and Patanol eyedrops.  For her eczema, she was restarted on Dupixent  every 2 weeks.  We continue with clobetasol  twice weekly as she was doing.  For her asthma, we continue with Symbicort  2 puffs twice daily as well as Flovent  2 puffs twice daily, Singulair , and Dupixent .  She has Pulmicort  that she adds during flares.  For her GERD, she was  continued on omeprazole  40 mg in the morning.  Since last visit, she has mostly been well.  Following recent retinal surgery, she developed a chest cold that resulted in wheezing and coughing. She managed these symptoms at home without antibiotics or steroids.   Asthma/Respiratory Symptom History: Currently, she experiences mild wheezing and uses a spiral to aid mucus clearance. Her asthma management includes Symbicort  and a rescue inhaler, though there was a delay in receiving her emergency inhaler due to a backorder. She has not used steroids since November of the previous year. She has not restarted Dupixent  due to recent illnesses and surgeries, including dental surgery, and is a few days past the due date for her next dose.  However, she has only a few days late at this point.  She has not had a prednisone  burst since November 2024 when I last saw her.   Allergic Rhinitis Symptom History: Environmental allergies are under good control with the Singulair  and Xyzal .  This combination seems to be working well for her.  Skin Symptom History: Skin is under good control with the topical steroid.  The Dupixent  has been very helpful as  GERD Symptom History: She continues to experience heartburn and takes omeprazole  regularly. Delays in medication have led to increased symptoms.    Her diabetes management involves metformin  and a glucose monitor, which she finds helpful, though she faces insurance coverage issues for the monitor. Her blood sugar levels fluctuate between 200 and 300 mg/dL, partly due to inadequate snacking.  This is felt to be related to all of her steroids she has received over her lifetime.  Otherwise, there have been no changes to her past medical history, surgical history, family history, or social history.    Review of systems otherwise negative other than that mentioned in the HPI.    Objective:   Blood pressure 110/78, pulse 99, temperature (!) 97.3 F (36.3 C),  temperature source Temporal, resp. rate 16, height 5' 5 (1.651 m), weight 209 lb 6.4 oz (95 kg), SpO2 96%. Body mass index is 34.85 kg/m.    Physical Exam Vitals reviewed.  Constitutional:      Appearance: She is well-developed.     Comments: Speaking in halting sentences.   HENT:     Head: Normocephalic and atraumatic.     Right Ear: Tympanic membrane, ear canal and external ear normal.     Left Ear: Tympanic membrane, ear canal and external ear normal.     Nose: Mucosal edema and rhinorrhea present. No nasal deformity or septal deviation.     Right Turbinates: Enlarged, swollen and pale.     Left Turbinates: Enlarged, swollen and pale.     Right Sinus: No maxillary sinus tenderness or frontal sinus tenderness.     Left Sinus: No maxillary sinus tenderness or frontal sinus tenderness.     Comments: Congestion looks much better than it has in the past.    Mouth/Throat:     Lips: Pink.     Mouth: Mucous membranes are moist. Mucous membranes are not pale and not dry.     Pharynx: Uvula  midline.     Comments: Cobblestoning with postnasal drip.  Eyes:     General: Lids are normal. No allergic shiner.       Right eye: No discharge.        Left eye: No discharge.     Conjunctiva/sclera: Conjunctivae normal.     Right eye: Right conjunctiva is not injected. No chemosis.    Left eye: Left conjunctiva is not injected. No chemosis.    Pupils: Pupils are equal, round, and reactive to light.  Cardiovascular:     Rate and Rhythm: Normal rate and regular rhythm.     Heart sounds: Normal heart sounds.  Pulmonary:     Effort: Pulmonary effort is normal. No tachypnea, accessory muscle usage or respiratory distress.     Breath sounds: No transmitted upper airway sounds. Examination of the right-lower field reveals decreased breath sounds. Examination of the left-lower field reveals decreased breath sounds. Decreased breath sounds present. No wheezing, rhonchi or rales.     Comments: Decreased  air movement at the bases.  Chest:     Chest wall: No tenderness.  Lymphadenopathy:     Cervical: No cervical adenopathy.  Skin:    General: Skin is warm.     Capillary Refill: Capillary refill takes less than 2 seconds.     Coloration: Skin is not pale.     Findings: Rash present. No abrasion, erythema or petechiae. Rash is not papular, urticarial or vesicular.     Comments: She does have some eczematous patches behind her ears as well as behind her neck and upper back.  Overall, her face is fairly dry. No crusting or oozing noted. Excoriations over the lesions noted.   Neurological:     Mental Status: She is alert.  Psychiatric:        Behavior: Behavior is cooperative.      Diagnostic studies:    Spirometry: results normal (FEV1: 1.72/52%, FVC: 2.56/67%, FEV1/FVC: 67%).    Spirometry consistent with moderate obstructive disease.    Allergy Studies: none       Marty Shaggy, MD  [facility] 

## 2024-09-05 NOTE — Addendum Note (Signed)
 Addended by: DANIEL NIVIA DEL on: 09/05/2024 11:15 AM   Modules accepted: Orders

## 2024-09-24 ENCOUNTER — Other Ambulatory Visit: Payer: Self-pay

## 2024-09-26 ENCOUNTER — Other Ambulatory Visit: Payer: Self-pay

## 2024-09-26 NOTE — Progress Notes (Signed)
 Specialty Pharmacy Refill Coordination Note  [name] is a 23 y.o. female contacted today regarding refills of specialty medication(s) Dupilumab  (Dupixent )   Patient requested Delivery   Delivery date: 10/11/24   Verified address: [address]  [place] [address]   Medication will be filled on: 10/10/24

## 2024-10-09 ENCOUNTER — Encounter: Payer: Self-pay | Admitting: Neurology

## 2024-10-09 ENCOUNTER — Ambulatory Visit: Admitting: Neurology

## 2024-10-10 ENCOUNTER — Other Ambulatory Visit: Payer: Self-pay

## 2024-10-22 ENCOUNTER — Other Ambulatory Visit: Payer: Self-pay | Admitting: Allergy & Immunology

## 2024-10-30 ENCOUNTER — Other Ambulatory Visit (HOSPITAL_COMMUNITY): Payer: Self-pay

## 2024-10-31 ENCOUNTER — Other Ambulatory Visit (HOSPITAL_COMMUNITY): Payer: Self-pay

## 2024-11-02 ENCOUNTER — Other Ambulatory Visit: Payer: Self-pay

## 2024-11-06 ENCOUNTER — Other Ambulatory Visit: Payer: Self-pay

## 2024-11-08 ENCOUNTER — Encounter (INDEPENDENT_AMBULATORY_CARE_PROVIDER_SITE_OTHER): Admitting: Ophthalmology

## 2024-11-19 ENCOUNTER — Other Ambulatory Visit (INDEPENDENT_AMBULATORY_CARE_PROVIDER_SITE_OTHER): Payer: Self-pay | Admitting: Neurology

## 2024-11-19 DIAGNOSIS — G931 Anoxic brain damage, not elsewhere classified: Secondary | ICD-10-CM

## 2024-11-19 DIAGNOSIS — G252 Other specified forms of tremor: Secondary | ICD-10-CM

## 2024-11-19 DIAGNOSIS — R252 Cramp and spasm: Secondary | ICD-10-CM

## 2024-12-19 ENCOUNTER — Other Ambulatory Visit (INDEPENDENT_AMBULATORY_CARE_PROVIDER_SITE_OTHER): Payer: Self-pay | Admitting: Neurology

## 2024-12-19 DIAGNOSIS — G931 Anoxic brain damage, not elsewhere classified: Secondary | ICD-10-CM

## 2024-12-19 DIAGNOSIS — R252 Cramp and spasm: Secondary | ICD-10-CM

## 2024-12-19 DIAGNOSIS — G252 Other specified forms of tremor: Secondary | ICD-10-CM

## 2024-12-19 MED ORDER — CLONAZEPAM 0.5 MG PO TABS: 1.0000 mg | ORAL_TABLET | Freq: Two times a day (BID) | ORAL | 2 refills | Status: AC

## 2024-12-19 NOTE — Addendum Note (Signed)
 Addended by: CHARLOTT EDYTH HERO on: 12/19/2024 02:06 PM   Modules accepted: Orders

## 2025-01-28 ENCOUNTER — Ambulatory Visit (INDEPENDENT_AMBULATORY_CARE_PROVIDER_SITE_OTHER): Admitting: Neurology

## 2025-03-05 ENCOUNTER — Ambulatory Visit: Admitting: Allergy & Immunology

## 2025-03-25 ENCOUNTER — Ambulatory Visit: Admitting: Neurology
